# Patient Record
Sex: Male | Born: 1980 | Hispanic: Yes | Smoking: Never smoker
Health system: Southern US, Community
[De-identification: ages and names within clinical notes are randomized; demographics above are authoritative.]

---

## 2014-12-02 ENCOUNTER — Emergency Department (HOSPITAL_COMMUNITY): Payer: Self-pay | Admitting: Anesthesiology

## 2014-12-02 ENCOUNTER — Encounter (HOSPITAL_COMMUNITY): Payer: Self-pay | Admitting: Emergency Medicine

## 2014-12-02 ENCOUNTER — Emergency Department (HOSPITAL_COMMUNITY): Payer: Self-pay

## 2014-12-02 ENCOUNTER — Inpatient Hospital Stay (HOSPITAL_COMMUNITY)
Admission: EM | Admit: 2014-12-02 | Discharge: 2014-12-07 | DRG: 025 | Disposition: A | Discharge status: Home / Self Care | Payer: Self-pay | Attending: Neurosurgery | Admitting: Neurosurgery

## 2014-12-02 ENCOUNTER — Encounter (HOSPITAL_COMMUNITY)
Admission: EM | Disposition: A | Discharge status: Home / Self Care | Payer: Self-pay | Source: Home / Self Care | Attending: Neurosurgery

## 2014-12-02 DIAGNOSIS — E876 Hypokalemia: Secondary | ICD-10-CM | POA: Diagnosis present

## 2014-12-02 DIAGNOSIS — G936 Cerebral edema: Secondary | ICD-10-CM | POA: Diagnosis present

## 2014-12-02 DIAGNOSIS — S065X9A Traumatic subdural hemorrhage with loss of consciousness of unspecified duration, initial encounter: Secondary | ICD-10-CM | POA: Diagnosis present

## 2014-12-02 DIAGNOSIS — D649 Anemia, unspecified: Secondary | ICD-10-CM | POA: Diagnosis present

## 2014-12-02 DIAGNOSIS — W19XXXA Unspecified fall, initial encounter: Secondary | ICD-10-CM | POA: Diagnosis present

## 2014-12-02 DIAGNOSIS — S065XAA Traumatic subdural hemorrhage with loss of consciousness status unknown, initial encounter: Secondary | ICD-10-CM | POA: Diagnosis present

## 2014-12-02 DIAGNOSIS — S065X0A Traumatic subdural hemorrhage without loss of consciousness, initial encounter: Principal | ICD-10-CM | POA: Diagnosis present

## 2014-12-02 DIAGNOSIS — F17210 Nicotine dependence, cigarettes, uncomplicated: Secondary | ICD-10-CM | POA: Diagnosis present

## 2014-12-02 DIAGNOSIS — I629 Nontraumatic intracranial hemorrhage, unspecified: Secondary | ICD-10-CM | POA: Insufficient documentation

## 2014-12-02 DIAGNOSIS — R4702 Dysphasia: Secondary | ICD-10-CM | POA: Diagnosis present

## 2014-12-02 DIAGNOSIS — S0219XA Other fracture of base of skull, initial encounter for closed fracture: Secondary | ICD-10-CM | POA: Insufficient documentation

## 2014-12-02 DIAGNOSIS — R40241 Glasgow coma scale score 13-15: Secondary | ICD-10-CM | POA: Diagnosis present

## 2014-12-02 HISTORY — PX: CRANIOTOMY: SHX93

## 2014-12-02 LAB — ETHANOL

## 2014-12-02 LAB — CBC
HCT: 35.5 % — ABNORMAL LOW (ref 39.0–52.0)
HEMOGLOBIN: 12.4 g/dL — AB (ref 13.0–17.0)
MCH: 31.2 pg (ref 26.0–34.0)
MCHC: 34.9 g/dL (ref 30.0–36.0)
MCV: 89.4 fL (ref 78.0–100.0)
Platelets: 235 10*3/uL (ref 150–400)
RBC: 3.97 MIL/uL — ABNORMAL LOW (ref 4.22–5.81)
RDW: 12.6 % (ref 11.5–15.5)
WBC: 5 10*3/uL (ref 4.0–10.5)

## 2014-12-02 LAB — I-STAT CHEM 8, ED
BUN: 12 mg/dL (ref 6–23)
CHLORIDE: 98 mmol/L (ref 96–112)
Calcium, Ion: 1.15 mmol/L (ref 1.12–1.23)
Creatinine, Ser: 0.8 mg/dL (ref 0.50–1.35)
Glucose, Bld: 109 mg/dL — ABNORMAL HIGH (ref 70–99)
HCT: 45 % (ref 39.0–52.0)
Hemoglobin: 15.3 g/dL (ref 13.0–17.0)
Potassium: 3.3 mmol/L — ABNORMAL LOW (ref 3.5–5.1)
SODIUM: 139 mmol/L (ref 135–145)
TCO2: 24 mmol/L (ref 0–100)

## 2014-12-02 LAB — APTT: APTT: 31 s (ref 24–37)

## 2014-12-02 LAB — CBC WITH DIFFERENTIAL/PLATELET
BASOS PCT: 1 % (ref 0–1)
Basophils Absolute: 0 10*3/uL (ref 0.0–0.1)
Eosinophils Absolute: 0.1 10*3/uL (ref 0.0–0.7)
Eosinophils Relative: 2 % (ref 0–5)
HEMATOCRIT: 38.9 % — AB (ref 39.0–52.0)
HEMOGLOBIN: 13.3 g/dL (ref 13.0–17.0)
Lymphocytes Relative: 34 % (ref 12–46)
Lymphs Abs: 1.7 10*3/uL (ref 0.7–4.0)
MCH: 30.6 pg (ref 26.0–34.0)
MCHC: 34.2 g/dL (ref 30.0–36.0)
MCV: 89.4 fL (ref 78.0–100.0)
MONO ABS: 0.5 10*3/uL (ref 0.1–1.0)
MONOS PCT: 10 % (ref 3–12)
NEUTROS ABS: 2.6 10*3/uL (ref 1.7–7.7)
Neutrophils Relative %: 53 % (ref 43–77)
Platelets: 280 10*3/uL (ref 150–400)
RBC: 4.35 MIL/uL (ref 4.22–5.81)
RDW: 12.6 % (ref 11.5–15.5)
WBC: 4.9 10*3/uL (ref 4.0–10.5)

## 2014-12-02 LAB — BASIC METABOLIC PANEL
Anion gap: 9 (ref 5–15)
BUN: 9 mg/dL (ref 6–23)
CO2: 28 mmol/L (ref 19–32)
Calcium: 9.1 mg/dL (ref 8.4–10.5)
Chloride: 102 mmol/L (ref 96–112)
Creatinine, Ser: 0.76 mg/dL (ref 0.50–1.35)
GFR calc Af Amer: >90 mL/min (ref >90)
GFR calc non Af Amer: >90 mL/min (ref >90)
Glucose, Bld: 103 mg/dL — ABNORMAL HIGH (ref 70–99)
POTASSIUM: 2.8 mmol/L — AB (ref 3.5–5.1)
Sodium: 139 mmol/L (ref 135–145)

## 2014-12-02 LAB — ABO/RH: ABO/RH(D): O POS

## 2014-12-02 LAB — TYPE AND SCREEN
ABO/RH(D): O POS
Antibody Screen: NEGATIVE

## 2014-12-02 LAB — PROTIME-INR
INR: 1.02 (ref 0.00–1.49)
PROTHROMBIN TIME: 13.5 s (ref 11.6–15.2)

## 2014-12-02 LAB — MRSA PCR SCREENING: MRSA by PCR: NEGATIVE

## 2014-12-02 SURGERY — CRANIOTOMY HEMATOMA EVACUATION SUBDURAL
Anesthesia: General | Site: Head | Laterality: Left

## 2014-12-02 MED ORDER — PROMETHAZINE HCL 25 MG PO TABS: 12.5000 mg | ORAL_TABLET | ORAL | Status: DC | PRN

## 2014-12-02 MED ORDER — ONDANSETRON HCL 4 MG PO TABS: 4.0000 mg | ORAL_TABLET | ORAL | Status: DC | PRN

## 2014-12-02 MED ORDER — PROPOFOL 10 MG/ML IV BOLUS
INTRAVENOUS | Status: DC | PRN
  Administered 2014-12-02: 40 mg via INTRAVENOUS
  Administered 2014-12-02: 160 mg via INTRAVENOUS

## 2014-12-02 MED ORDER — SODIUM CHLORIDE 0.9 % IR SOLN
Status: DC | PRN
  Administered 2014-12-02: 19:00:00

## 2014-12-02 MED ORDER — BUPIVACAINE-EPINEPHRINE 0.5% -1:200000 IJ SOLN
INTRAMUSCULAR | Status: DC | PRN
  Administered 2014-12-02: 15 mL

## 2014-12-02 MED ORDER — PANTOPRAZOLE SODIUM 40 MG IV SOLR
40.0000 mg | Freq: Every day | INTRAVENOUS | Status: DC
  Administered 2014-12-02 – 2014-12-03 (×2): 40 mg via INTRAVENOUS
  Filled 2014-12-02 (×4): qty 40

## 2014-12-02 MED ORDER — EPHEDRINE SULFATE 50 MG/ML IJ SOLN
INTRAMUSCULAR | Status: AC
Start: 2014-12-02 — End: 2014-12-02
  Filled 2014-12-02: qty 1

## 2014-12-02 MED ORDER — FENTANYL CITRATE 0.05 MG/ML IJ SOLN
INTRAMUSCULAR | Status: AC
  Filled 2014-12-02: qty 5

## 2014-12-02 MED ORDER — ARTIFICIAL TEARS OP OINT
TOPICAL_OINTMENT | OPHTHALMIC | Status: AC
  Filled 2014-12-02: qty 3.5

## 2014-12-02 MED ORDER — HEMOSTATIC AGENTS (NO CHARGE) OPTIME
TOPICAL | Status: DC | PRN
  Administered 2014-12-02: 1 via TOPICAL

## 2014-12-02 MED ORDER — SODIUM CHLORIDE 0.9 % IJ SOLN
INTRAMUSCULAR | Status: AC
  Filled 2014-12-02: qty 20

## 2014-12-02 MED ORDER — FENTANYL CITRATE 0.05 MG/ML IJ SOLN
INTRAMUSCULAR | Status: DC | PRN
  Administered 2014-12-02 (×5): 50 ug via INTRAVENOUS

## 2014-12-02 MED ORDER — OXYCODONE HCL 5 MG/5ML PO SOLN
5.0000 mg | Freq: Once | ORAL | Status: DC | PRN
  Filled 2014-12-02: qty 5

## 2014-12-02 MED ORDER — CEFAZOLIN SODIUM-DEXTROSE 2-3 GM-% IV SOLR
2.0000 g | Freq: Three times a day (TID) | INTRAVENOUS | Status: AC
  Administered 2014-12-03 (×2): 2 g via INTRAVENOUS
  Filled 2014-12-02 (×2): qty 50

## 2014-12-02 MED ORDER — LABETALOL HCL 5 MG/ML IV SOLN
INTRAVENOUS | Status: AC
  Filled 2014-12-02: qty 4

## 2014-12-02 MED ORDER — FENTANYL CITRATE 0.05 MG/ML IJ SOLN
25.0000 ug | INTRAMUSCULAR | Status: DC | PRN
  Administered 2014-12-02: 25 ug via INTRAVENOUS

## 2014-12-02 MED ORDER — ONDANSETRON HCL 4 MG/2ML IJ SOLN
4.0000 mg | INTRAMUSCULAR | Status: DC | PRN
  Administered 2014-12-03: 4 mg via INTRAVENOUS
  Filled 2014-12-02: qty 2

## 2014-12-02 MED ORDER — LEVETIRACETAM IN NACL 500 MG/100ML IV SOLN
500.0000 mg | Freq: Two times a day (BID) | INTRAVENOUS | Status: DC
  Filled 2014-12-02: qty 100

## 2014-12-02 MED ORDER — LABETALOL HCL 5 MG/ML IV SOLN: 10.0000 mg | INTRAVENOUS | Status: DC | PRN

## 2014-12-02 MED ORDER — LACTATED RINGERS IV SOLN
INTRAVENOUS | Status: DC | PRN
  Administered 2014-12-02: 17:00:00 via INTRAVENOUS

## 2014-12-02 MED ORDER — ACETAMINOPHEN 650 MG RE SUPP: 650.0000 mg | RECTAL | Status: DC | PRN

## 2014-12-02 MED ORDER — SUCCINYLCHOLINE CHLORIDE 20 MG/ML IJ SOLN
INTRAMUSCULAR | Status: DC | PRN
  Administered 2014-12-02: 100 mg via INTRAVENOUS

## 2014-12-02 MED ORDER — PROPOFOL 10 MG/ML IV BOLUS
INTRAVENOUS | Status: AC
  Filled 2014-12-02: qty 20

## 2014-12-02 MED ORDER — LEVETIRACETAM IN NACL 500 MG/100ML IV SOLN
500.0000 mg | INTRAVENOUS | Status: AC
  Administered 2014-12-02: 500 mg via INTRAVENOUS
  Filled 2014-12-02: qty 100

## 2014-12-02 MED ORDER — POTASSIUM CHLORIDE IN NACL 20-0.9 MEQ/L-% IV SOLN
INTRAVENOUS | Status: DC
  Administered 2014-12-02: 23:00:00 via INTRAVENOUS
  Filled 2014-12-02 (×2): qty 1000

## 2014-12-02 MED ORDER — LIDOCAINE HCL (CARDIAC) 20 MG/ML IV SOLN
INTRAVENOUS | Status: AC
  Filled 2014-12-02: qty 5

## 2014-12-02 MED ORDER — MIDAZOLAM HCL 5 MG/5ML IJ SOLN
INTRAMUSCULAR | Status: DC | PRN
  Administered 2014-12-02: 2 mg via INTRAVENOUS

## 2014-12-02 MED ORDER — MIDAZOLAM HCL 2 MG/2ML IJ SOLN
INTRAMUSCULAR | Status: AC
  Filled 2014-12-02: qty 2

## 2014-12-02 MED ORDER — FENTANYL CITRATE 0.05 MG/ML IJ SOLN
INTRAMUSCULAR | Status: AC
  Filled 2014-12-02: qty 2

## 2014-12-02 MED ORDER — ONDANSETRON HCL 4 MG/2ML IJ SOLN
INTRAMUSCULAR | Status: DC | PRN
  Administered 2014-12-02: 4 mg via INTRAVENOUS

## 2014-12-02 MED ORDER — SUCCINYLCHOLINE CHLORIDE 20 MG/ML IJ SOLN
INTRAMUSCULAR | Status: AC
  Filled 2014-12-02: qty 1

## 2014-12-02 MED ORDER — CEFAZOLIN SODIUM-DEXTROSE 2-3 GM-% IV SOLR
INTRAVENOUS | Status: DC | PRN
  Administered 2014-12-02: 2 g via INTRAVENOUS

## 2014-12-02 MED ORDER — BACITRACIN ZINC 500 UNIT/GM EX OINT
TOPICAL_OINTMENT | CUTANEOUS | Status: DC | PRN
  Administered 2014-12-02: 1 via TOPICAL

## 2014-12-02 MED ORDER — ESMOLOL HCL 10 MG/ML IV SOLN
INTRAVENOUS | Status: AC
  Filled 2014-12-02: qty 10

## 2014-12-02 MED ORDER — ROCURONIUM BROMIDE 50 MG/5ML IV SOLN
INTRAVENOUS | Status: AC
  Filled 2014-12-02: qty 2

## 2014-12-02 MED ORDER — ACETAMINOPHEN 325 MG PO TABS
650.0000 mg | ORAL_TABLET | ORAL | Status: DC | PRN
  Administered 2014-12-07: 650 mg via ORAL
  Filled 2014-12-02: qty 2

## 2014-12-02 MED ORDER — ONDANSETRON HCL 4 MG/2ML IJ SOLN
INTRAMUSCULAR | Status: AC
  Filled 2014-12-02: qty 2

## 2014-12-02 MED ORDER — LEVETIRACETAM IN NACL 500 MG/100ML IV SOLN
500.0000 mg | Freq: Two times a day (BID) | INTRAVENOUS | Status: DC
  Administered 2014-12-03 – 2014-12-05 (×5): 500 mg via INTRAVENOUS
  Filled 2014-12-02 (×6): qty 100

## 2014-12-02 MED ORDER — THROMBIN 20000 UNITS EX SOLR
CUTANEOUS | Status: DC | PRN
  Administered 2014-12-02: 19:00:00 via TOPICAL

## 2014-12-02 MED ORDER — PROPOFOL 10 MG/ML IV BOLUS
INTRAVENOUS | Status: AC
Start: 2014-12-02 — End: 2014-12-02
  Filled 2014-12-02: qty 20

## 2014-12-02 MED ORDER — LABETALOL HCL 5 MG/ML IV SOLN
INTRAVENOUS | Status: DC | PRN
  Administered 2014-12-02 (×3): 2.5 mg via INTRAVENOUS

## 2014-12-02 MED ORDER — OXYCODONE HCL 5 MG PO TABS: 5.0000 mg | ORAL_TABLET | Freq: Once | ORAL | Status: DC | PRN

## 2014-12-02 MED ORDER — PHENYLEPHRINE 40 MCG/ML (10ML) SYRINGE FOR IV PUSH (FOR BLOOD PRESSURE SUPPORT)
PREFILLED_SYRINGE | INTRAVENOUS | Status: AC
  Filled 2014-12-02: qty 20

## 2014-12-02 MED ORDER — MORPHINE SULFATE 2 MG/ML IJ SOLN
1.0000 mg | INTRAMUSCULAR | Status: DC | PRN
  Administered 2014-12-03: 1 mg via INTRAVENOUS
  Administered 2014-12-03 – 2014-12-07 (×15): 2 mg via INTRAVENOUS
  Filled 2014-12-02 (×17): qty 1

## 2014-12-02 MED ORDER — ONDANSETRON HCL 4 MG/2ML IJ SOLN: 4.0000 mg | Freq: Four times a day (QID) | INTRAMUSCULAR | Status: DC | PRN

## 2014-12-02 MED ORDER — MICROFIBRILLAR COLL HEMOSTAT EX POWD
CUTANEOUS | Status: DC | PRN
  Administered 2014-12-02: 1 g via TOPICAL

## 2014-12-02 MED ORDER — LIDOCAINE HCL (CARDIAC) 20 MG/ML IV SOLN
INTRAVENOUS | Status: DC | PRN
  Administered 2014-12-02: 60 mg via INTRAVENOUS

## 2014-12-02 MED ORDER — 0.9 % SODIUM CHLORIDE (POUR BTL) OPTIME
TOPICAL | Status: DC | PRN
  Administered 2014-12-02 (×2): 1000 mL

## 2014-12-02 SURGICAL SUPPLY — 73 items
BAG DECANTER FOR FLEXI CONT (MISCELLANEOUS) ×3 IMPLANT
BIT DRILL WIRE PASS 1.3MM (BIT) IMPLANT
BLADE CLIPPER SURG NEURO (BLADE) ×3 IMPLANT
BRUSH SCRUB EZ 1% IODOPHOR (MISCELLANEOUS) IMPLANT
BRUSH SCRUB EZ PLAIN DRY (MISCELLANEOUS) ×3 IMPLANT
BUR ACORN 6.0 PRECISION (BURR) ×2 IMPLANT
BUR ACORN 6.0MM PRECISION (BURR) ×1
BUR ROUTER D-58 CRANI (BURR) ×3 IMPLANT
CANISTER SUCT 3000ML PPV (MISCELLANEOUS) ×3 IMPLANT
CLIP TI MEDIUM 6 (CLIP) IMPLANT
CONT SPEC 4OZ CLIKSEAL STRL BL (MISCELLANEOUS) ×3 IMPLANT
COVER BACK TABLE 60X90IN (DRAPES) IMPLANT
DRAIN SNY WOU 7FLT (WOUND CARE) IMPLANT
DRAPE NEUROLOGICAL W/INCISE (DRAPES) ×3 IMPLANT
DRAPE SURG 17X23 STRL (DRAPES) IMPLANT
DRAPE WARM FLUID 44X44 (DRAPE) ×3 IMPLANT
DRILL WIRE PASS 1.3MM (BIT)
DRSG TELFA 3X8 NADH (GAUZE/BANDAGES/DRESSINGS) IMPLANT
ELECT CAUTERY BLADE 6.4 (BLADE) IMPLANT
ELECT REM PT RETURN 9FT ADLT (ELECTROSURGICAL) ×3
ELECTRODE REM PT RTRN 9FT ADLT (ELECTROSURGICAL) ×1 IMPLANT
EVACUATOR 1/8 PVC DRAIN (DRAIN) IMPLANT
EVACUATOR SILICONE 100CC (DRAIN) IMPLANT
FORCEPS BIPOLAR SPETZLER 8 1.0 (NEUROSURGERY SUPPLIES) ×3 IMPLANT
GAUZE SPONGE 4X4 12PLY STRL (GAUZE/BANDAGES/DRESSINGS) ×3 IMPLANT
GAUZE SPONGE 4X4 16PLY XRAY LF (GAUZE/BANDAGES/DRESSINGS) IMPLANT
GLOVE BIO SURGEON STRL SZ 6.5 (GLOVE) ×4 IMPLANT
GLOVE BIO SURGEON STRL SZ8 (GLOVE) ×3 IMPLANT
GLOVE BIO SURGEON STRL SZ8.5 (GLOVE) ×3 IMPLANT
GLOVE BIO SURGEONS STRL SZ 6.5 (GLOVE) ×2
GLOVE BIOGEL PI IND STRL 6.5 (GLOVE) ×1 IMPLANT
GLOVE BIOGEL PI INDICATOR 6.5 (GLOVE) ×2
GLOVE EXAM NITRILE LRG STRL (GLOVE) IMPLANT
GLOVE EXAM NITRILE MD LF STRL (GLOVE) IMPLANT
GLOVE EXAM NITRILE XL STR (GLOVE) IMPLANT
GLOVE EXAM NITRILE XS STR PU (GLOVE) IMPLANT
GOWN STRL REUS W/ TWL LRG LVL3 (GOWN DISPOSABLE) ×1 IMPLANT
GOWN STRL REUS W/ TWL XL LVL3 (GOWN DISPOSABLE) ×1 IMPLANT
GOWN STRL REUS W/TWL LRG LVL3 (GOWN DISPOSABLE) ×2
GOWN STRL REUS W/TWL XL LVL3 (GOWN DISPOSABLE) ×2
KIT BASIN OR (CUSTOM PROCEDURE TRAY) ×3 IMPLANT
KIT CLIP RANEY GUN (KITS) ×3 IMPLANT
KIT ROOM TURNOVER OR (KITS) ×3 IMPLANT
MARKER SKIN DUAL TIP RULER LAB (MISCELLANEOUS) IMPLANT
NEEDLE HYPO 22GX1.5 SAFETY (NEEDLE) ×3 IMPLANT
NS IRRIG 1000ML POUR BTL (IV SOLUTION) ×6 IMPLANT
PACK CRANIOTOMY (CUSTOM PROCEDURE TRAY) ×3 IMPLANT
PAD ARMBOARD 7.5X6 YLW CONV (MISCELLANEOUS) ×6 IMPLANT
PATTIES SURGICAL .25X.25 (GAUZE/BANDAGES/DRESSINGS) IMPLANT
PATTIES SURGICAL .5 X.5 (GAUZE/BANDAGES/DRESSINGS) IMPLANT
PATTIES SURGICAL .5 X3 (DISPOSABLE) IMPLANT
PATTIES SURGICAL 1X1 (DISPOSABLE) IMPLANT
PIN MAYFIELD SKULL DISP (PIN) ×3 IMPLANT
PLATE 1.5  2HOLE MED NEURO (Plate) ×6 IMPLANT
PLATE 1.5 2HOLE MED NEURO (Plate) ×3 IMPLANT
RUBBERBAND STERILE (MISCELLANEOUS) IMPLANT
SCREW SELF DRILL HT 1.5/4MM (Screw) ×18 IMPLANT
SPONGE NEURO XRAY DETECT 1X3 (DISPOSABLE) IMPLANT
SPONGE SURGIFOAM ABS GEL 100 (HEMOSTASIS) ×3 IMPLANT
STAPLER SKIN PROX WIDE 3.9 (STAPLE) ×3 IMPLANT
SUT ETHILON 3 0 FSL (SUTURE) IMPLANT
SUT NURALON 4 0 TR CR/8 (SUTURE) ×6 IMPLANT
SUT PROLENE 6 0 BV (SUTURE) IMPLANT
SUT VIC AB 2-0 CP2 18 (SUTURE) ×3 IMPLANT
SUT VIC AB 3-0 FS2 27 (SUTURE) IMPLANT
SUT VICRYL 4-0 PS2 18IN ABS (SUTURE) IMPLANT
SYR CONTROL 10ML LL (SYRINGE) ×3 IMPLANT
TAPE CLOTH SURG 4X10 WHT LF (GAUZE/BANDAGES/DRESSINGS) ×3 IMPLANT
TOWEL OR 17X24 6PK STRL BLUE (TOWEL DISPOSABLE) ×3 IMPLANT
TOWEL OR 17X26 10 PK STRL BLUE (TOWEL DISPOSABLE) ×3 IMPLANT
TRAY FOLEY CATH 14FRSI W/METER (CATHETERS) IMPLANT
UNDERPAD 30X30 INCONTINENT (UNDERPADS AND DIAPERS) IMPLANT
WATER STERILE IRR 1000ML POUR (IV SOLUTION) ×3 IMPLANT
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
## 2014-12-02 NOTE — ED Notes (Signed)
Pt denies having family or friends that could come to the hospital or speak to our physician.

## 2014-12-02 NOTE — Transfer of Care (Signed)
Immediate Anesthesia Transfer of Care Note  Patient: Brett Cole  Procedure(s) Performed: Procedure(s): Craniotomy for Evacuation of Subdural Hematoma (Left)  Patient Location: PACU  Anesthesia Type:General  Level of Consciousness: awake and alert   Airway & Oxygen Therapy: Patient Spontanous Breathing and Patient connected to nasal cannula oxygen  Post-op Assessment: Report given to RN and Post -op Vital signs reviewed and stable  Post vital signs: Reviewed and stable  Last Vitals:  Filed Vitals:   12/02/14 1700  BP: 112/83  Pulse: 67  Temp:   Resp: 16    Complications: No apparent anesthesia complications   BP 116/92, HR 79, SPO2 100. Pt moving all extremities. Awake and alert.

## 2014-12-02 NOTE — ED Notes (Signed)
Pt does not speak [place]. PA will translate.
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
## 2014-12-02 NOTE — Op Note (Signed)
Brief history: The patient is a 34 year old Hispanic immigrant from [place] who presented to the ER with dysphasia. He was worked up with a head CT which demonstrated a left subdural hematoma, cerebral contusions, and midline shift. I discussed the situation as best I could with the patient dysphasia. I recommended surgery. I explained the risks, benefits, alternatives, and likelihood of achieving her goals with surgery. I answered all the patient's questions. He has decided to proceed with surgery. There are no family members available.  Preop diagnosis: Left subdural hematoma, left cerebral contusions,  Postop diagnosis: Same  Procedure: Left craniotomy for evacuation of subdural hematoma  Surgeon: Dr. Delma OfficerJeff Lenoria Narine  Assistant: None  Anesthesia: Gen. endotracheal  Estimated blood loss: 100 mL  Specimens: None  Drains: One subtemporal Jackson-Pratt drain  Complications: None  Description of procedure: The patient was brought to the operating room by the anesthesia team. General endotracheal anesthesia was induced. I applied the Mayfield 3 point headrest to the patient's calvarium. A roll was placed under his left shoulder and his head was turned to the right. The patient's left scalp was shaved with clippers and then prepared with Betadine scrub and Betadine solution. Sterile drapes were applied. I then injected the area to be incised with Marcaine with epinephrine solution. I used the scalpel to make a pterional type incision in the patient's left scalp. I used Raney clips for wound edge hemostasis. I incised the underlying temporalis muscle and fascia with the electrocautery. I exposed the underlying calvarium with electrocautery and periosteal elevators. I used the cerebellar retractors for exposure. I then used a high-speed drill to create a temporal and parietal burr hole. I used the foot plate device to create a craniotomy flap. I elevated the craniotomy flap with a Penfield #1. I incised  the underlying dura with the 15 blade scalpel. We encountered a fairly large acute to subacute subdural hematoma. I removed it using suction and irrigation. The patient's frontal and temporal lobe were quite contused. We irrigated the subdural space out with saline solution. We obtained hemostasis with irrigation and bipolar electrocautery. I then reapproximated patient's dura with a running and interrupted 4-0 Nurolon suture. I placed a large piece of Gelfoam over the exposed dura and replaced the craniotomy flap with titanium mini plates and screws. I placed a 10 mm flat Jackson-Pratt drain in the subtemporal space and tunneled it out through a separate stab wound. I reapproximated patient's galea and temporalis fascia with interrupted 2-0 Vicryl suture. I reapproximated the galea with interrupted 2-0 Vicryl suture. I reapproximated the skin with stainless steel staples. The wound was then coated with bacitracin ointment. A sterile dressing was applied. The drapes were removed. I then removed the Mayfield 3 point headrest on the patient's calvarium. The patient was subsequent extubated by the anesthesia team and transported to the post anesthesia care unit in stable condition. By report all sponge, instrument, and needle counts were correct at the end this case.
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
## 2014-12-02 NOTE — ED Notes (Addendum)
Pt transported to Neuro OR safely. Pt belongings in labeled pt belonging bag, pants with belt, socks, shoes, a cell phone, shirt, coat. OR Consent for procedure transported with pt.

## 2014-12-02 NOTE — Anesthesia Procedure Notes (Signed)
Procedure Name: Intubation Date/Time: 12/02/2014 5:51 PM Performed by: Dairl PonderJIANG, Tashonda Pinkus Pre-anesthesia Checklist: Patient identified, Timeout performed, Emergency Drugs available, Suction available and Patient being monitored Patient Re-evaluated:Patient Re-evaluated prior to inductionOxygen Delivery Method: Circle system utilized, Simple face mask, Non-rebreather mask, Nasal cannula and Ambu bag Preoxygenation: Pre-oxygenation with 100% oxygen Intubation Type: IV induction and Rapid sequence Laryngoscope Size: Mac and 3 Grade View: Grade I Tube type: Oral Tube size: 7.5 mm Number of attempts: 1 Placement Confirmation: ETT inserted through vocal cords under direct vision,  breath sounds checked- equal and bilateral and positive ETCO2 Secured at: 23 cm Tube secured with: Tape Dental Injury: Teeth and Oropharynx as per pre-operative assessment

## 2014-12-02 NOTE — Anesthesia Preprocedure Evaluation (Signed)
Anesthesia Evaluation  Patient identified by MRN, date of birth, ID band Patient awake    Reviewed: Allergy & Precautions, NPO status , Patient's Chart, lab work & pertinent test results  Airway Mallampati: I   Neck ROM: full    Dental   Pulmonary Current Smoker,  breath sounds clear to auscultation        Cardiovascular negative cardio ROS  Rhythm:regular Rate:Normal     Neuro/Psych Pt has a SDH    GI/Hepatic   Endo/Other    Renal/GU      Musculoskeletal   Abdominal   Peds  Hematology   Anesthesia Other Findings   Reproductive/Obstetrics                             Anesthesia Physical Anesthesia Plan  ASA: I and emergent  Anesthesia Plan: General   Post-op Pain Management:    Induction: Intravenous, Rapid sequence and Cricoid pressure planned  Airway Management Planned: Oral ETT  Additional Equipment:   Intra-op Plan:   Post-operative Plan: Extubation in OR  Informed Consent: I have reviewed the patients History and Physical, chart, labs and discussed the procedure including the risks, benefits and alternatives for the proposed anesthesia with the patient or authorized representative who has indicated his/her understanding and acceptance.     Plan Discussed with: CRNA, Anesthesiologist and Surgeon  Anesthesia Plan Comments:         Anesthesia Quick Evaluation

## 2014-12-02 NOTE — Anesthesia Postprocedure Evaluation (Signed)
  Anesthesia Post-op Note  Patient: Brett Cole  Procedure(s) Performed: Procedure(s): Craniotomy for Evacuation of Subdural Hematoma (Left)  Patient Location: PACU  Anesthesia Type: General   Level of Consciousness: awake, alert  and oriented  Airway and Oxygen Therapy: Patient Spontanous Breathing  Post-op Pain: mild  Post-op Assessment: Post-op Vital signs reviewed  Post-op Vital Signs: Reviewed  Last Vitals:  Filed Vitals:   12/02/14 2015  BP: 139/90  Pulse: 66  Temp: 36.4 C  Resp: 23    Complications: No apparent anesthesia complications

## 2014-12-03 ENCOUNTER — Encounter (HOSPITAL_COMMUNITY): Payer: Self-pay | Admitting: Neurosurgery

## 2014-12-03 ENCOUNTER — Inpatient Hospital Stay (HOSPITAL_COMMUNITY): Payer: Self-pay

## 2014-12-03 DIAGNOSIS — I6200 Nontraumatic subdural hemorrhage, unspecified: Secondary | ICD-10-CM

## 2014-12-03 DIAGNOSIS — I629 Nontraumatic intracranial hemorrhage, unspecified: Secondary | ICD-10-CM | POA: Insufficient documentation

## 2014-12-03 LAB — BASIC METABOLIC PANEL
Anion gap: 8 (ref 5–15)
BUN: 6 mg/dL (ref 6–23)
CO2: 26 mmol/L (ref 19–32)
Calcium: 8.7 mg/dL (ref 8.4–10.5)
Chloride: 105 mmol/L (ref 96–112)
Creatinine, Ser: 0.72 mg/dL (ref 0.50–1.35)
GFR calc Af Amer: >90 mL/min (ref >90)
Glucose, Bld: 105 mg/dL — ABNORMAL HIGH (ref 70–99)
Potassium: 3.8 mmol/L (ref 3.5–5.1)
Sodium: 139 mmol/L (ref 135–145)

## 2014-12-03 LAB — MAGNESIUM: MAGNESIUM: 1.9 mg/dL (ref 1.5–2.5)

## 2014-12-03 LAB — CBC WITH DIFFERENTIAL/PLATELET
BASOS ABS: 0 10*3/uL (ref 0.0–0.1)
Basophils Relative: 0 % (ref 0–1)
Eosinophils Absolute: 0.1 10*3/uL (ref 0.0–0.7)
Eosinophils Relative: 1 % (ref 0–5)
HCT: 34.9 % — ABNORMAL LOW (ref 39.0–52.0)
Hemoglobin: 11.8 g/dL — ABNORMAL LOW (ref 13.0–17.0)
LYMPHS PCT: 14 % (ref 12–46)
Lymphs Abs: 1.1 10*3/uL (ref 0.7–4.0)
MCH: 30.6 pg (ref 26.0–34.0)
MCHC: 33.8 g/dL (ref 30.0–36.0)
MCV: 90.6 fL (ref 78.0–100.0)
Monocytes Absolute: 0.8 10*3/uL (ref 0.1–1.0)
Monocytes Relative: 10 % (ref 3–12)
NEUTROS PCT: 75 % (ref 43–77)
Neutro Abs: 6.2 10*3/uL (ref 1.7–7.7)
PLATELETS: 246 10*3/uL (ref 150–400)
RBC: 3.85 MIL/uL — ABNORMAL LOW (ref 4.22–5.81)
RDW: 12.8 % (ref 11.5–15.5)
WBC: 8.2 10*3/uL (ref 4.0–10.5)

## 2014-12-03 LAB — RAPID URINE DRUG SCREEN, HOSP PERFORMED
Amphetamines: NOT DETECTED
Barbiturates: NOT DETECTED
Benzodiazepines: POSITIVE — AB
COCAINE: POSITIVE — AB
Opiates: POSITIVE — AB
Tetrahydrocannabinol: POSITIVE — AB

## 2014-12-03 LAB — PHOSPHORUS: PHOSPHORUS: 3.5 mg/dL (ref 2.3–4.6)

## 2014-12-03 LAB — TROPONIN I: Troponin I: <0.03 ng/mL (ref <0.031)

## 2014-12-03 MED ORDER — FOLIC ACID 1 MG PO TABS
1.0000 mg | ORAL_TABLET | Freq: Every day | ORAL | Status: DC
  Administered 2014-12-03 – 2014-12-07 (×5): 1 mg via ORAL
  Filled 2014-12-03 (×5): qty 1

## 2014-12-03 MED ORDER — POTASSIUM CHLORIDE 10 MEQ/100ML IV SOLN
10.0000 meq | INTRAVENOUS | Status: AC
  Administered 2014-12-03 (×3): 10 meq via INTRAVENOUS
  Filled 2014-12-03 (×4): qty 100

## 2014-12-03 MED ORDER — THIAMINE HCL 100 MG/ML IJ SOLN
100.0000 mg | Freq: Every day | INTRAMUSCULAR | Status: DC
  Administered 2014-12-03: 100 mg via INTRAVENOUS
  Filled 2014-12-03 (×2): qty 1

## 2014-12-03 MED ORDER — SODIUM CHLORIDE 0.9 % IV SOLN
INTRAVENOUS | Status: DC
  Administered 2014-12-03 – 2014-12-04 (×2): via INTRAVENOUS
  Administered 2014-12-05: 1000 mL via INTRAVENOUS
  Administered 2014-12-06: 23:00:00 via INTRAVENOUS
  Administered 2014-12-06: 1000 mL via INTRAVENOUS
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
## 2014-12-04 DIAGNOSIS — S0219XA Other fracture of base of skull, initial encounter for closed fracture: Secondary | ICD-10-CM

## 2014-12-04 LAB — BASIC METABOLIC PANEL
ANION GAP: 7 (ref 5–15)
BUN: <5 mg/dL — ABNORMAL LOW (ref 6–23)
CALCIUM: 8.6 mg/dL (ref 8.4–10.5)
CHLORIDE: 101 mmol/L (ref 96–112)
CO2: 28 mmol/L (ref 19–32)
Creatinine, Ser: 0.73 mg/dL (ref 0.50–1.35)
GFR calc Af Amer: >90 mL/min (ref >90)
GFR calc non Af Amer: >90 mL/min (ref >90)
Glucose, Bld: 126 mg/dL — ABNORMAL HIGH (ref 70–99)
Potassium: 3.3 mmol/L — ABNORMAL LOW (ref 3.5–5.1)
SODIUM: 136 mmol/L (ref 135–145)

## 2014-12-04 LAB — MAGNESIUM: MAGNESIUM: 1.8 mg/dL (ref 1.5–2.5)

## 2014-12-04 MED ORDER — VITAMIN B-1 100 MG PO TABS
100.0000 mg | ORAL_TABLET | Freq: Every day | ORAL | Status: DC
  Administered 2014-12-04 – 2014-12-07 (×4): 100 mg via ORAL
  Filled 2014-12-04 (×4): qty 1

## 2014-12-04 MED ORDER — PANTOPRAZOLE SODIUM 40 MG PO TBEC
40.0000 mg | DELAYED_RELEASE_TABLET | Freq: Every day | ORAL | Status: DC
  Administered 2014-12-04 – 2014-12-07 (×4): 40 mg via ORAL
  Filled 2014-12-04 (×4): qty 1
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
## 2014-12-05 MED ORDER — LEVETIRACETAM 500 MG PO TABS
500.0000 mg | ORAL_TABLET | Freq: Two times a day (BID) | ORAL | Status: DC
  Administered 2014-12-05 – 2014-12-07 (×4): 500 mg via ORAL
  Filled 2014-12-05 (×4): qty 1
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
## 2014-12-05 NOTE — Progress Notes (Signed)
Patient ID: Brett Cole, male   DOB: 03/14/1981, 34 y.o.   MRN: [id number] Subjective:  The patient is alert and pleasant. He is accompanied by his girlfriend.  Objective: Vital signs in last 24 hours: Temp:  [97.8 F (36.6 C)-98.8 F (37.1 C)] 98.6 F (37 C) (03/17 1626) Pulse Rate:  [62-82] 62 (03/17 1626) Resp:  [16-18] 18 (03/17 1626) BP: (84-110)/(54-68) 102/60 mmHg (03/17 1838) SpO2:  [98 %-100 %] 98 % (03/17 1626)  Intake/Output from previous day: 03/16 0701 - 03/17 0700 In: 480 [P.O.:480] Out: -  Intake/Output this shift:    Physical exam the patient is alert and pleasant. The patient remains dysphasic. He is Glasgow Coma Scale 13 without change. His strength is normal. His wound is healing well.  Lab Results:  Recent Labs  12/02/14 2139 12/03/14 0915  WBC 5.0 8.2  HGB 12.4* 11.8*  HCT 35.5* 34.9*  PLT 235 246   BMET  Recent Labs  12/03/14 0915 12/04/14 1055  NA 139 136  K 3.8 3.3*  CL 105 101  CO2 26 28  GLUCOSE 105* 126*  BUN 6 <5*  CREATININE 0.72 0.73  CALCIUM 8.7 8.6    Studies/Results: No results found.  Assessment/Plan: Left subdural hematoma, cerebral contusions, dysphasia: The patient is making progress. I will ask rehabilitation to see the patient.  LOS: 3 days     Tanija Germani D 12/05/2014, 6:46 PM
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
## 2014-12-05 NOTE — Progress Notes (Signed)
Pt walked the length of the entire nursing station with stand by assist, tolerated well.

## 2014-12-05 NOTE — Clinical Documentation Improvement (Addendum)
(  1) Please document the clinical significance, if any, of the left temporal bone fracture noted on the CT report.  (2) Please document if a condition below provides greater specificity regarding the 8 mm of midline shift along the left cerebral convexity and subcortical edema:  Possible Clinical Conditions?    - Cerebral Edema  - Other Condition  - Unable to Clinically Determine  CT Head  12/02/14 Brain: There is a subdural hematoma around the left cerebral convexity 8 mm in thickness in the left frontal region. The hematoma is mixed density compatible with the history. Subcortical edema present in the temporal and frontal poles on the left. There are also 2 areas of subcortical edema in the high and posterior left frontal lobe, with a 2 cm hematoma more anteriorly. These higher areas of cortical edema are in an atypical location for contusion (given lack of overlying fracture) but is the best explanation given constellation of findings. There is midline shift to the right by 8 mm without ventricular entrapment or ACA infarct. Thin hematoma along the posterior clivus measuring 7-8 mm. There is a discrete subdural hematoma in the anterior foramen magnum without mass effect on the cervicomedullary junction. IMPRESSION: 1. Mixed density subdural hematoma along the left cerebral convexity with 8 mm of midline shift. 2. Multifocal left cerebral contusion with 2 cm hematoma. 3. Retroclival and ventral foramen magnum subdural hematoma without mass effect on the brain. 4. Questionable nondisplaced left temporal bone fracture with small focal mastoid effusion. 5. Negative for cervical spine fracture. Lateral atlantodental asymmetry which is likely positional.   Thank You, Jerral Ralphathy R Trew Sunde ,RN Clinical Documentation Specialist:  [phone] [facility]- [facility] Information Management

## 2014-12-05 NOTE — Progress Notes (Signed)
Key Points: Use following P&T approved IV to PO antibiotic change policy.  Description contains the criteria that are approved Note: Policy Excludes:  Esophagectomy patientsPHARMACIST - PHYSICIAN COMMUNICATION  DR:   Lovell SheehanJenkins and colleagues CONCERNING: IV to Oral Route Change Policy  RECOMMENDATION: This patient is receiving Keppra by the intravenous route.  Based on criteria approved by the Pharmacy and Therapeutics Committee, the intravenous medication(s) is/are being converted to the equivalent oral dose form(s).   DESCRIPTION: These criteria include:  The patient is eating (either orally or via tube) and/or has been taking other orally administered medications for a least 24 hours  The patient has no evidence of active gastrointestinal bleeding or impaired GI absorption (gastrectomy, short bowel, patient on TNA or NPO).  If you have questions about this conversion, please contact the Pharmacy Department  []   [phone]( [phone] )  Brett Cole [x]   [phone]( [phone] )  Brett Cole  []   [phone]( [phone] )  [facility] []   [phone]( [phone] )  [facility]   Brett Cole, PharmD, BCPS Clinical pharmacist, pager [phone] 12/05/2014 11:54 AM

## 2014-12-06 NOTE — Progress Notes (Signed)
Speech Language Pathology Treatment: Cognitive-Linquistic  Patient Details Name: Brett Cole MRN: [id number] DOB: 06/15/1981 Today's Date: 12/06/2014 Time: [id number] SLP Time Calculation (min) (ACUTE ONLY): 25 min  Assessment / Plan / Recommendation Clinical Impression  Pt demonstrates mild improvement in receptive and expressive language. Treatment provided in Spanish. Pt able to identify familiar objects via Y/N with 90% accuracy, but often perseveration impedes accuracy with more complex Y/N questions. Pt has severe phonemic paraphasias, worsened with spontaneous speech and with complexity of words. Pt will not be able to complete basic ADLs due to severely impaired receptive and expressive deficits. Girlfriend at bedside. SLP educated pt and girlfriend on the nature of aphasia. Situation concerning given need for outpatient f/u and lack of resources and family support. Will continue to follow and reach out to community resources for advice.    HPI     Pertinent Vitals Pain Assessment: Faces Faces Pain Scale: No hurt  SLP Plan  Continue with current plan of care    Recommendations  Outpatient SLP f/u.               Oral Care Recommendations: Oral care BID Follow up Recommendations: Outpatient SLP Plan: Continue with current plan of care    GO    [facility]enter LLCBonnie Aydee Mcnew, MA CCC-SLP [phone]  Claudine MoutonDeBlois, [place] 12/06/2014, 2:40 PM

## 2014-12-06 NOTE — Progress Notes (Signed)
Physical medicine and rehabilitation consult requested with chart reviewed. Presently modified independent 300 feet without assistive device. Patient able to negotiate around obstacles and furniture without deficits. Recommendations  for outpatient therapies. Patient will not need inpatient rehabilitation services. Hold on formal rehabilitation consult at this time.
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
## 2014-12-06 NOTE — Progress Notes (Signed)
Patient ID: Brett DankerFrancisco Cole, male   DOB: 03/20/1981, 34 y.o.   MRN: [id number] Subjective:  The patient is alert and pleasant. He remains dysphasic.  Objective: Vital signs in last 24 hours: Temp:  [97.7 F (36.5 C)-98.8 F (37.1 C)] 97.9 F (36.6 C) (03/18 0428) Pulse Rate:  [57-70] 57 (03/18 0428) Resp:  [18] 18 (03/17 1626) BP: (84-103)/(51-61) 93/58 mmHg (03/18 0428) SpO2:  [98 %-100 %] 100 % (03/18 0428)  Intake/Output from previous day: 03/17 0701 - 03/18 0700 In: -  Out: 300 [Urine:300] Intake/Output this shift:    Physical exam the patient is alert and pleasant. He is dysphasic. He is moving all 4 extremities well. His wound is healing well.  Lab Results:  Recent Labs  12/03/14 0915  WBC 8.2  HGB 11.8*  HCT 34.9*  PLT 246   BMET  Recent Labs  12/03/14 0915 12/04/14 1055  NA 139 136  K 3.8 3.3*  CL 105 101  CO2 26 28  GLUCOSE 105* 126*  BUN 6 <5*  CREATININE 0.72 0.73  CALCIUM 8.7 8.6    Studies/Results: No results found.  Assessment/Plan: Postop day #4, cerebral contusions, cerebral edema: The patient is improving neurologically. From my point of view he is ready for discharge but we have a problem with disposition. I will ask care management to see the patient.  LOS: 4 days     Graesyn Schreifels D 12/06/2014, 7:41 AM
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
## 2014-12-07 MED ORDER — HYDROCODONE-ACETAMINOPHEN 5-325 MG PO TABS
1.0000 | ORAL_TABLET | Freq: Once | ORAL | Status: AC
  Administered 2014-12-07: 1 via ORAL
  Filled 2014-12-07: qty 1

## 2014-12-07 MED ORDER — HYDROCODONE-ACETAMINOPHEN 5-325 MG PO TABS: 1.0000 | ORAL_TABLET | ORAL | Status: AC | PRN

## 2014-12-07 MED ORDER — LEVETIRACETAM 500 MG PO TABS: 500.0000 mg | ORAL_TABLET | Freq: Two times a day (BID) | ORAL | Status: AC

## 2014-12-07 NOTE — Progress Notes (Signed)
Patient ready for discharge to home; friend at bedside to accompany him home; case manager assisted with Rx's and Social worker provided a taxi voucher for discharge; interpreter called for Spanish speaking translation of discharge instructions; instructions given and reviewed with patient; also printed in [place]. Patient dressed and ready to leave;at time of discharge patient became agitated over losing his cell phone; room searched; no receipt from security office of any valuables; clothing and shoes only belongings in room; friend states, he may have lost it at the time of admission or it is at his residence; encourage patient to check at home upon discharge; patient became calmer and stated he was ready to leave; accompanied to taxi by staff.  Emergency resources were reviewed with patient and his visitor.  Follow up appointments reviewed as well, instructed to call for the appointment with Dr.Jenkins for f/u and staple removal from his head.

## 2014-12-07 NOTE — Discharge Summary (Signed)
Physician Discharge Summary  Patient ID: Brett Cole MRN: [id number] DOB/AGE: 03-18-1981 34 y.o.  Admit date: 12/02/2014 Discharge date: 12/07/2014  Admission Diagnoses: Left subdural hematoma, left cerebral contusions, dysphasia  Discharge Diagnoses: The same Active Problems:   Subdural hematoma   Intracranial bleed   Temporal bone fracture   Discharged Condition: fair  Hospital Course: I performed a left craniotomy for evacuation of subdural hematoma on the patient on 12/02/2014.  The patient's postoperative course was remarkable only for dysphasia. We had speech therapy see the patient. They have arranged outpatient therapy. I had the case manager see the patient regarding disposition. Arrangements for made for the patient be discharged to home. He was instructed to follow-up with me.  On 12/07/2014 patient was stable for discharge to home. He was given oral and written discharge instructions. I answered all his girlfriend's questions. The patient will be discharged on Her as I think there is a significant risk of seizures with his multiple cerebral contusions.  Consults: Speech therapy Significant Diagnostic Studies: Head CT Treatments: Left craniotomy for evacuation of subdural hematoma Discharge Exam: Blood pressure 109/68, pulse 64, temperature 97.8 F (36.6 C), temperature source Oral, resp. rate 18, height 5' (1.524 m), weight 63.504 kg (140 lb), SpO2 100 %. The patient is alert and pleasant. His strength is normal. His wound is healing well. His pupils are equal. He remains dysphasic but this is improving significantly.  Disposition: Home  Discharge Instructions    Call MD for:  difficulty breathing, headache or visual disturbances    Complete by:  As directed      Call MD for:  extreme fatigue    Complete by:  As directed      Call MD for:  hives    Complete by:  As directed      Call MD for:  persistant dizziness or light-headedness    Complete by:  As  directed      Call MD for:  persistant nausea and vomiting    Complete by:  As directed      Call MD for:  redness, tenderness, or signs of infection (pain, swelling, redness, odor or green/yellow discharge around incision site)    Complete by:  As directed      Call MD for:  severe uncontrolled pain    Complete by:  As directed      Call MD for:  temperature >100.4    Complete by:  As directed      Diet - low sodium heart healthy    Complete by:  As directed      Discharge instructions    Complete by:  As directed   Call [phone] for a followup appointment. Take a stool softener while you are using pain medications.     Driving Restrictions    Complete by:  As directed   Do not drive for 2 weeks.     Increase activity slowly    Complete by:  As directed      Lifting restrictions    Complete by:  As directed   Do not lift more than 5 pounds. No excessive bending or twisting.     May shower / Bathe    Complete by:  As directed   He may shower after the pain she is removed 3 days after surgery. Leave the incision alone.     No dressing needed    Complete by:  As directed      No wound care  Complete by:  As directed             Medication List    TAKE these medications        HYDROcodone-acetaminophen 5-325 MG per tablet  Commonly known as:  NORCO  Take 1 tablet by mouth every 4 (four) hours as needed for moderate pain.     levETIRAcetam 500 MG tablet  Commonly known as:  KEPPRA  Take 1 tablet (500 mg total) by mouth 2 (two) times daily.           Follow-up Information    [facility]     On 12/09/2014.   Why:  at 11:15 am; please try to keep your apt or call to reschedule   Contact information:   [address] [place] [phone]      Signed: Cristi LoronJENKINS,Tyrisha Benninger D 12/07/2014, 9:43 AM

## 2014-12-07 NOTE — Progress Notes (Signed)
Pt provided with taxi voucher for transportation home.
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
## 2014-12-11 ENCOUNTER — Ambulatory Visit: Payer: Self-pay | Attending: Family Medicine | Admitting: Family Medicine

## 2014-12-11 VITALS — BP 109/70 | HR 99 | Temp 97.4°F | Resp 16 | Ht 62.0 in | Wt 134.0 lb

## 2014-12-11 DIAGNOSIS — R1314 Dysphagia, pharyngoesophageal phase: Secondary | ICD-10-CM

## 2014-12-11 DIAGNOSIS — S065X0A Traumatic subdural hemorrhage without loss of consciousness, initial encounter: Secondary | ICD-10-CM

## 2014-12-11 NOTE — Patient Instructions (Signed)
Follow-instructions from hospital as to symptoms to return to hospital for. Take medications as prescribed. Talk to financial people. Make appointment with primary care doctor here for 2 weeks.
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
## 2014-12-19 ENCOUNTER — Ambulatory Visit: Payer: Self-pay | Admitting: Family Medicine

## 2014-12-27 ENCOUNTER — Ambulatory Visit: Payer: Self-pay | Attending: Family Medicine | Admitting: Internal Medicine

## 2014-12-27 ENCOUNTER — Encounter: Payer: Self-pay | Admitting: Internal Medicine

## 2014-12-27 VITALS — BP 116/78 | HR 72 | Temp 98.8°F | Resp 16 | Wt 132.0 lb

## 2014-12-27 DIAGNOSIS — F1010 Alcohol abuse, uncomplicated: Secondary | ICD-10-CM | POA: Insufficient documentation

## 2014-12-27 DIAGNOSIS — F1911 Other psychoactive substance abuse, in remission: Secondary | ICD-10-CM

## 2014-12-27 DIAGNOSIS — F1011 Alcohol abuse, in remission: Secondary | ICD-10-CM

## 2014-12-27 DIAGNOSIS — Z87898 Personal history of other specified conditions: Secondary | ICD-10-CM

## 2014-12-27 DIAGNOSIS — R51 Headache: Secondary | ICD-10-CM | POA: Insufficient documentation

## 2014-12-27 DIAGNOSIS — Z139 Encounter for screening, unspecified: Secondary | ICD-10-CM

## 2014-12-27 DIAGNOSIS — Z8679 Personal history of other diseases of the circulatory system: Secondary | ICD-10-CM

## 2014-12-27 DIAGNOSIS — R519 Headache, unspecified: Secondary | ICD-10-CM

## 2014-12-27 LAB — COMPLETE METABOLIC PANEL WITH GFR
ALT: 11 U/L (ref 0–53)
AST: 21 U/L (ref 0–37)
Albumin: 4.4 g/dL (ref 3.5–5.2)
Alkaline Phosphatase: 69 U/L (ref 39–117)
BUN: 11 mg/dL (ref 6–23)
CO2: 27 meq/L (ref 19–32)
Calcium: 9.3 mg/dL (ref 8.4–10.5)
Chloride: 101 mEq/L (ref 96–112)
Creat: 0.65 mg/dL (ref 0.50–1.35)
GFR, Est African American: >89 mL/min
GFR, Est Non African American: >89 mL/min
Glucose, Bld: 87 mg/dL (ref 70–99)
POTASSIUM: 4.1 meq/L (ref 3.5–5.3)
SODIUM: 140 meq/L (ref 135–145)
Total Bilirubin: 0.9 mg/dL (ref 0.2–1.2)
Total Protein: 7.1 g/dL (ref 6.0–8.3)

## 2014-12-27 MED ORDER — NAPROXEN 500 MG PO TABS: 500.0000 mg | ORAL_TABLET | Freq: Two times a day (BID) | ORAL | Status: AC
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
## 2014-12-27 NOTE — Progress Notes (Signed)
MRN: [id number] Name: Brett Cole  Sex: male Age: 34 y.o. DOB: [birth year]  Allergies: Review of patient's allergies indicates no known allergies.  Chief Complaint  Patient presents with  . Establish Care    HPI: Patient is 34 y.o. male who has history of alcohol abuse, polysubstance abuse, last month he was hospitalized with symptoms of headache, EMR reviewed patient had a fall prior to going to the hospital he had a subdural hematoma which was evacuated neurosurgery was on board subsequently he was discharged to continue with Keppra, currently patient is alert oriented x3 has occasional headache denies any numbness weakness,vision is still drink alcohol, I have advised patient to cut down and quit. Patient denies smoking cigarettes.   History reviewed. No pertinent past medical history.  Past Surgical History  Procedure Laterality Date  . Craniotomy Left 12/02/2014    Procedure: Craniotomy for Evacuation of Subdural Hematoma;  Surgeon: Tressie Stalker, MD;  Location: [facility] NEURO ORS;  Service: Neurosurgery;  Laterality: Left;      Medication List       This list is accurate as of: 12/27/14 12:40 PM.  Always use your most recent med list.               HYDROcodone-acetaminophen 5-325 MG per tablet  Commonly known as:  NORCO  Take 1 tablet by mouth every 4 (four) hours as needed for moderate pain.     levETIRAcetam 500 MG tablet  Commonly known as:  KEPPRA  Take 1 tablet (500 mg total) by mouth 2 (two) times daily.     naproxen 500 MG tablet  Commonly known as:  NAPROSYN  Take 1 tablet (500 mg total) by mouth 2 (two) times daily with a meal.        Meds ordered this encounter  Medications  . naproxen (NAPROSYN) 500 MG tablet    Sig: Take 1 tablet (500 mg total) by mouth 2 (two) times daily with a meal.    Dispense:  30 tablet    Refill:  2     There is no immunization history on file for this patient.  Family History  Problem Relation Age of Onset  .  Hypertension Mother   . Stroke Father     History  Substance Use Topics  . Smoking status: Never Smoker   . Smokeless tobacco: Not on file  . Alcohol Use: 12.6 oz/week    21 Cans of beer per week    Review of Systems   As noted in HPI  Filed Vitals:   12/27/14 1236  BP: 116/78  Pulse: 72  Temp:   Resp:     Physical Exam  Physical Exam  Constitutional: He is oriented to person, place, and time.  HENT:  Left-side on scalp healing scar no apparent discharge   Eyes: EOM are normal. Pupils are equal, round, and reactive to light.  Neck: Neck supple.  Cardiovascular: Normal rate and regular rhythm.   Pulmonary/Chest: Breath sounds normal. No respiratory distress. He has no wheezes. He has no rales.  Abdominal: Bowel sounds are normal. He exhibits no distension. There is no tenderness. There is no rebound.  Musculoskeletal: He exhibits no edema.  Neurological: He is alert and oriented to person, place, and time. He has normal reflexes. No cranial nerve deficit. He exhibits normal muscle tone. Coordination normal.  Psychiatric: Affect normal.    CBC    Component Value Date/Time   WBC 8.2 12/03/2014 0915   RBC 3.85* 12/03/2014  0915   HGB 11.8* 12/03/2014 0915   HCT 34.9* 12/03/2014 0915   PLT 246 12/03/2014 0915   MCV 90.6 12/03/2014 0915   LYMPHSABS 1.1 12/03/2014 0915   MONOABS 0.8 12/03/2014 0915   EOSABS 0.1 12/03/2014 0915   BASOSABS 0.0 12/03/2014 0915    CMP     Component Value Date/Time   NA 136 12/04/2014 1055   K 3.3* 12/04/2014 1055   CL 101 12/04/2014 1055   CO2 28 12/04/2014 1055   GLUCOSE 126* 12/04/2014 1055   BUN <5* 12/04/2014 1055   CREATININE 0.73 12/04/2014 1055   CALCIUM 8.6 12/04/2014 1055   GFRNONAA >90 12/04/2014 1055   GFRAA >90 12/04/2014 1055    No results found for: CHOL  No results found for: HGBA1C  No results found for: AST  Assessment and Plan  History of subdural hemorrhage - Plan: status post Craniotomy and   Evacuation  done last month patient is on Keppra Ambulatory referral to Neurology  Headache, unspecified headache type - Plan: naproxen (NAPROSYN) 500 MG tablet, Ambulatory referral to Neurology  History of alcohol abuse - Plan: COMPLETE METABOLIC PANEL WITH GFR  History of substance abuse - Plan: HIV antibody (with reflex), Hepatitis B surface antibody, Hepatitis B surface antigen, Hepatitis C RNA quantitative  Screening - Plan: Vit D  25 hydroxy (rtn osteoporosis monitoring), Hemoglobin A1c    Return in about 3 months (around 03/28/2015), or if symptoms worsen or fail to improve.   This note has been created with Education officer, environmentalDragon speech recognition software and smart phrase technology. Any transcriptional errors are unintentional.    Doris CheadleADVANI, Sarah-Jane Nazario, MD

## 2014-12-28 LAB — HEPATITIS B SURFACE ANTIBODY,QUALITATIVE: HEP B S AB: NEGATIVE

## 2014-12-28 LAB — HEMOGLOBIN A1C
Hgb A1c MFr Bld: 5.4 % (ref <5.7)
MEAN PLASMA GLUCOSE: 108 mg/dL (ref <117)

## 2014-12-28 LAB — HEPATITIS B SURFACE ANTIGEN: Hepatitis B Surface Ag: NEGATIVE

## 2014-12-28 LAB — HIV ANTIBODY (ROUTINE TESTING W REFLEX): HIV 1&2 Ab, 4th Generation: NONREACTIVE

## 2014-12-28 LAB — VITAMIN D 25 HYDROXY (VIT D DEFICIENCY, FRACTURES): VIT D 25 HYDROXY: 14 ng/mL — AB (ref 30–100)

## 2014-12-30 ENCOUNTER — Telehealth: Payer: Self-pay

## 2014-12-30 LAB — HEPATITIS C RNA QUANTITATIVE: HCV QUANT: NOT DETECTED [IU]/mL (ref <15)

## 2014-12-30 MED ORDER — VITAMIN D (ERGOCALCIFEROL) 1.25 MG (50000 UNIT) PO CAPS: 50000.0000 [IU] | ORAL_CAPSULE | ORAL | Status: AC

## 2014-12-30 NOTE — Telephone Encounter (Signed)
Interpreter line used Patient not available Left message to return our call 

## 2014-12-30 NOTE — Telephone Encounter (Signed)
-----   Message from Doris Cheadleeepak Advani, MD sent at 12/30/2014  9:29 AM EDT ----- Blood work reviewed, noticed low vitamin D, call patient advise to start ergocalciferol 50,000 units once a week for the duration of  12 weeks, then take OTC vitamin d 2000 units daily.

## 2014-12-31 ENCOUNTER — Telehealth: Payer: Self-pay

## 2014-12-31 NOTE — Telephone Encounter (Signed)
-----   Message from Doris Cheadleeepak Advani, MD sent at 12/31/2014  9:41 AM EDT ----- Call and let the patient know that his test for hepatitis C is negative

## 2014-12-31 NOTE — Telephone Encounter (Signed)
Interpreter line used carlos [phone] Patient not available

## 2015-01-08 ENCOUNTER — Ambulatory Visit: Payer: Self-pay | Attending: Internal Medicine

## 2015-12-21 ENCOUNTER — Emergency Department (HOSPITAL_COMMUNITY): Payer: Self-pay

## 2015-12-21 ENCOUNTER — Emergency Department (HOSPITAL_COMMUNITY)
Admission: EM | Admit: 2015-12-21 | Discharge: 2015-12-22 | Disposition: A | Discharge status: Home / Self Care | Payer: Self-pay | Attending: Emergency Medicine | Admitting: Emergency Medicine

## 2015-12-21 DIAGNOSIS — S022XXA Fracture of nasal bones, initial encounter for closed fracture: Secondary | ICD-10-CM

## 2015-12-21 DIAGNOSIS — S0083XA Contusion of other part of head, initial encounter: Secondary | ICD-10-CM | POA: Insufficient documentation

## 2015-12-21 DIAGNOSIS — F10129 Alcohol abuse with intoxication, unspecified: Secondary | ICD-10-CM | POA: Insufficient documentation

## 2015-12-21 DIAGNOSIS — Y9289 Other specified places as the place of occurrence of the external cause: Secondary | ICD-10-CM | POA: Insufficient documentation

## 2015-12-21 DIAGNOSIS — X58XXXA Exposure to other specified factors, initial encounter: Secondary | ICD-10-CM | POA: Insufficient documentation

## 2015-12-21 DIAGNOSIS — Y998 Other external cause status: Secondary | ICD-10-CM | POA: Insufficient documentation

## 2015-12-21 DIAGNOSIS — Y9389 Activity, other specified: Secondary | ICD-10-CM | POA: Insufficient documentation

## 2015-12-21 DIAGNOSIS — F10929 Alcohol use, unspecified with intoxication, unspecified: Secondary | ICD-10-CM

## 2015-12-21 DIAGNOSIS — R040 Epistaxis: Secondary | ICD-10-CM

## 2015-12-21 LAB — CBC
HCT: 42.1 % (ref 39.0–52.0)
Hemoglobin: 14.7 g/dL (ref 13.0–17.0)
MCH: 30 pg (ref 26.0–34.0)
MCHC: 34.9 g/dL (ref 30.0–36.0)
MCV: 85.9 fL (ref 78.0–100.0)
PLATELETS: 262 10*3/uL (ref 150–400)
RBC: 4.9 MIL/uL (ref 4.22–5.81)
RDW: 12.5 % (ref 11.5–15.5)
WBC: 5 10*3/uL (ref 4.0–10.5)

## 2015-12-21 LAB — COMPREHENSIVE METABOLIC PANEL
ALBUMIN: 4.1 g/dL (ref 3.5–5.0)
ALT: 28 U/L (ref 17–63)
AST: 44 U/L — AB (ref 15–41)
Alkaline Phosphatase: 84 U/L (ref 38–126)
Anion gap: 13 (ref 5–15)
CHLORIDE: 110 mmol/L (ref 101–111)
CO2: 23 mmol/L (ref 22–32)
Calcium: 8.7 mg/dL — ABNORMAL LOW (ref 8.9–10.3)
Creatinine, Ser: 0.64 mg/dL (ref 0.61–1.24)
GFR calc Af Amer: >60 mL/min (ref >60)
GFR calc non Af Amer: >60 mL/min (ref >60)
GLUCOSE: 99 mg/dL (ref 65–99)
POTASSIUM: 3.5 mmol/L (ref 3.5–5.1)
Sodium: 146 mmol/L — ABNORMAL HIGH (ref 135–145)
Total Bilirubin: 0.6 mg/dL (ref 0.3–1.2)
Total Protein: 7.4 g/dL (ref 6.5–8.1)

## 2015-12-21 LAB — ETHANOL: ALCOHOL ETHYL (B): 396 mg/dL — AB (ref <5)
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
## 2015-12-21 NOTE — Discharge Instructions (Signed)
It was our pleasure to provide your ER care today - we hope that you feel better.  Your xrays show a nasal bone fracture.  Keep your head elevated as much as possible to help with swelling and pain.   Ice/coldpack to sore area.  Take tylenol/motrin as need.  Follow up with ENT doctor in the next 1-2 weeks - see referral - call office for appointment.  Return to ER if worse, new symptoms, new or severe pain, persistent nose bleed, persistent vomiting, other concern.  For alcohol abuse, use resource guide provided for community resources/help with drinking alcohol.      Hemorragia nasal (Nosebleed) Las hemorragias nasales son frecuentes Se deben a una grieta en la membrana interna de la nariz (membrana mucosa) o al sangrado de un pequeo vaso sanguneo. Las hemorragias nasales pueden tener su origen en numerosos trastornos, por ejemplo, lesiones, infecciones, sequedad de las [address] mucosas o clima seco, medicamentos, hurgarse la nariz y los sistemas hogareos de calefaccin y Barista. La Brett Cole de las hemorragias nasales provienen de los vasos sanguneos de la parte anterior de la Brett Cole. INSTRUCCIONES PARA EL CUIDADO EN EL HOGAR   Para tratar de controlar la hemorragia nasal, oprmase las fosas nasales de manera suave y continua durante por lo menos .  No se suene ni resople por la nariz durante varias horas despus de haber tenido una hemorragia nasal.  No se coloque usted mismo gasa dentro de la nariz. Si el mdico le tapon la nariz con una compresa, trate de dejrsela puesta hasta que el profesional se la retire.  Si le colocaron un tapn de gasa y este comienza a salirse, reemplcelo con cuidado por otro o crtele el extremo.  Si se [place] un catter con baln para taponarle la nariz, no lo corte ni lo retire a Scientist, water quality haya indicado Brett Cole.  No se acueste mientras tiene una hemorragia nasal. Sintese e inclnese hacia delante.  Use un aerosol  nasal descongestivo para aliviar una hemorragia nasal como se lo haya indicado el mdico.  No se ponga vaselina ni aceite de vaselina en la nariz. Estos productos pueden gotear dentro de los pulmones.  Mantenga la humedad en su casa; para ello, use menos el aire acondicionado o utilice un humidificador.  La aspirina y los anticoagulantes aumentan la probabilidad de hemorragias. Si le recetan estos medicamentos y tiene hemorragias nasales, consltele al mdico si debe dejar de tomarlos o ajustar la dosis. No deje de tomar los medicamentos, a menos que se lo haya indicado el mdico.  Retome sus actividades normales cuando pueda, pero intente no hacer esfuerzos, no Lexicographer pesos y no Actor cintura para Physicist, medical.  Si la hemorragia nasal se debe a la sequedad de [employer], use un gel o aerosol nasal de solucin salina de [employer]. Esto mantendr las mucosas hmedas y permitir que se Midwife. Si debe usar un lubricante, elija los que sean solubles en agua. selos de forma ocasional y no los use despus de varias horas de estar acostado.  Concurra a todas las visitas de control como se lo haya indicado el mdico. Esto es importante. SOLICITE ATENCIN MDICA SI:  Lance Muss.  Tiene hemorragias nasales con frecuencia.  Tiene hemorragias nasales cada vez ms seguido. SOLICITE ATENCIN MDICA DE INMEDIATO SI:  La hemorragia nasal dura ms de .  La hemorragia nasal ocurre despus de una lesin en la cara, y la nariz parece estar torcida o fracturada.  Tiene hemorragias fuera de lo comn en otras partes del cuerpo.  Tiene hematomas fuera de lo comn en otras partes del cuerpo.  Se siente mareado o se desmaya.  Tiene sudoracin.  Vomita sangre.  La hemorragia nasal ocurre despus de una lesin en la cabeza.   Esta informacin no tiene Theme park managercomo fin reemplazar el consejo del mdico. Asegrese de hacerle al mdico cualquier pregunta que  tenga.   Document Released: 06/16/2005 Document Revised: 09/27/2014 Elsevier Interactive Patient Education 2016 Brett Inc.     Fractura de la nariz (Nasal Fracture) Brett BelliniUna fractura de la Darene Lamernariz es una quebradura de los [name] o del cartlago de la nariz. Las fracturas menores no requieren tratamiento y suelen consolidarse solas despus de aproximadamente un mes. Las fracturas graves pueden necesitar Azerbaijanciruga. CAUSAS Generalmente, la causa de esta lesin es una contusin en la [name]. Este tipo de lesin suele producirse debido a lo siguiente:  Deportes de Pharmacologistcontacto.  Accidentes automovilsticos.  Cadas.  Puetazos. [name] Los sntomas de esta lesin incluyen lo siguiente:  Engineer, [name].  Inflamacin de la nariz.  Hemorragia nasal.  Hematoma alrededor de la nariz o de los ojos. Esto incluye tener los ojos morados.  Aspecto desviado de la nariz. DIAGNSTICO Esta lesin se puede diagnosticar mediante un examen fsico. El mdico tocar suavemente la nariz en busca de signos de huesos fracturados. Examinar el interior de las fosas nasales para asegurarse de que no haya una inflamacin llena de sangre en el tabique que las separa (hematoma septal). Es posible que las radiografas de la nariz no revelen la presencia de Brett Cole fractura, [place] Mountainaunque se haya producido [name] quebradura. En algunos casos se pueden hacer radiografas o una tomografa computarizada (TC) en el trmino de 1 a 5das despus de la lesin. A veces, el mdico querr esperar hasta que la inflamacin haya desaparecido. TRATAMIENTO Las fracturas menores que no causaron deformidades no suelen requerir TEFL teachertratamiento. Las fracturas ms graves con desplazamiento de los huesos pueden requerir Azerbaijanciruga, la cual se Education officer, environmentalrealizar una vez que la inflamacin haya desaparecido. La ciruga servir para estabilizar y Engineer, technical salesalinear la fractura. En algunos casos, es posible que un mdico logre reacomodar los huesos sin recurrir a Chief Technology Officeruna ciruga. Este procedimiento  se puede llevar a cabo en el consultorio del mdico despus de la administracin de un medicamento para adormecer la zona (anestesia local). INSTRUCCIONES PARA EL CUIDADO EN EL HOGAR  Si se lo indican, aplique hielo sobre la zona lesionada:  Ponga el hielo en una bolsa plstica.  Coloque una toalla entre la piel y la bolsa de hielo.  Coloque el hielo durante 20minutos, 2 a 3veces por Futures traderda.  Tome los medicamentos de venta libre y los recetados solamente como se lo haya indicado el mdico.  Si le sangra la nariz, sintese erguido y apritese las partes blandas de la nariz contra el tabique que separa las fosas nasales (tabique nasal) durante 10minutos.  Intente no sonarse la nariz.  Reanude sus actividades normales como se lo haya indicado el mdico. Pregntele al mdico qu actividades son seguras para usted.  Evite los deportes de contacto durante 3 o 4semanas, o como se lo haya indicado el mdico.  Concurra a todas las visitas de control como se lo haya indicado el mdico. Esto es importante. SOLICITE ATENCIN MDICA SI:  Aumenta el dolor o se vuelve intenso.  Sigue teniendo hemorragias nasales.  La forma de la nariz no vuelve a la normalidad en el trmino de 5das.  Le supura pus de la nariz. SOLICITE  ATENCIN MDICA DE INMEDIATO SI:  Tiene una hemorragia nasal que no se detiene despus de Personnel officer las fosas nasales durante y de aplicarse hielo en la nariz.  Le sale un lquido transparente de la Clinical cytogeneticist.  Advierte que tiene una inflamacin parecida a una uva en el tabique. Esta inflamacin es una acumulacin de sangre (hematoma) que se debe drenar para evitar las infecciones.  Tiene dificultad para mover los ojos.  Ha vomitado repetidas veces.   Esta informacin no tiene Theme park manager el consejo del mdico. Asegrese de hacerle al mdico cualquier pregunta que tenga.   Document Released: 06/16/2005 Document Revised: 05/28/2015 Elsevier  Interactive Patient Education 2016 Elsevier Inc.    Intoxicacin alcohlica (Alcohol Intoxication) La intoxicacin alcohlica se produce cuando la cantidad de alcohol que se ha consumido daa la capacidad de funcionamiento mental y fsico. El alcohol deteriora directamente la actividad qumica normal del cerebro. Beber grandes cantidades de alcohol puede conducir a Building control surveyor funcionamiento mental y en el comportamiento, y puede causar muchos efectos fsicos que pueden ser perjudiciales.  La intoxicacin alcohlica puede variar en gravedad desde leve hasta muy grave. Hay varios factores que pueden afectar el nivel de intoxicacin que se produce, como la edad de la persona, el sexo, el peso, la frecuencia de consumo de alcohol, y la presencia de otras enfermedades mdicas (como diabetes, convulsiones o enfermedades del corazn). Los niveles peligrosos de intoxicacin por alcohol pueden ocurrir [employer] personas beben grandes cantidades de alcohol en un corto periodo de tiempo ([place]). El alcohol tambin puede ser especialmente peligroso cuando se combina con ciertos medicamentos recetados o drogas "recreativas". SIGNOS Y [name] Algunos de los signos y sntomas comunes de intoxicacin leve por alcohol incluyen:  Prdida de la coordinacin.  Cambios en el estado de nimo y la conducta.  Incapacidad para razonar.  Hablar arrastrando las palabras. A medida que la intoxicacin por alcohol avanza a niveles ms graves, Zenaida Niece a Research officer, trade union otros signos y sntomas. Estos pueden ser:  Vmitos.  Confusin y alteracin de Scientist, clinical (histocompatibility and immunogenetics).  Disminucin de Engineer, manufacturing systems.  Convulsiones.  Prdida de la conciencia. DIAGNSTICO  El mdico le har una historia clnica y un examen fsico. Se le preguntar acerca de la cantidad y el tipo de alcohol que ha consumido. Se le realizarn anlisis de sangre para medir la concentracin de alcohol en sangre. En muchos lugares, el nivel de alcohol  en la sangre debe ser inferior a 80 mg / dL (1,61%) para poder conducir legalmente. Sin embargo, hay muchos efectos peligrosos del alcohol que pueden ocurrir con niveles mucho ms bajos.  TRATAMIENTO  Las personas con intoxicacin por alcohol a menudo no requieren TEFL teacher. La mayor parte de los efectos de la intoxicacin por alcohol son temporales, y desaparecen a medida que el alcohol abandona el cuerpo de forma natural. El profesional controlar su estado hasta que est lo suficientemente estable como para volver a casa. A veces se administran lquidos por va intravenosa para ayudar a evitar la deshidratacin.  INSTRUCCIONES PARA EL CUIDADO EN EL HOGAR  No conduzca vehculos despus de beber alcohol.  Mantngase hidratado. Beba gran cantidad de lquido para mantener la orina de tono claro o color amarillo plido. Evite la cafena.   Tome slo medicamentos de venta libre o recetados, segn las indicaciones del mdico.  SOLICITE ATENCIN MDICA SI:   Tiene vmitos persistentes.   No mejora luego de [employer].  Se intoxica con alcohol con frecuencia. El mdico podr ayudarlo a  decidir si debe consultar a un terapeuta especializado en el abuso de sustancias. SOLICITE ATENCIN MDICA DE INMEDIATO SI:   Se siente vacilante o tembloroso cuando trata de abandonar el hbito.   Comienza a temblar de manera incontrolable (convulsiones).   Vomita sangre. Puede ser sangre de color rojo brillante o similar al sedimento del caf negro.   Maxie Better en la materia fecal. Puede ser de color rojo brillante o de aspecto alquitranado, con olor ftido.   Se siente mareado o se desmaya.  ASEGRESE DE QUE:   Comprende estas instrucciones.  Controlar su afeccin.  Recibir ayuda de inmediato si no mejora o si empeora.   Esta informacin no tiene Theme park manager el consejo del mdico. Asegrese de hacerle al mdico cualquier pregunta que tenga.   Document Released: 09/06/2005  Document Revised: 05/09/2013 Elsevier Interactive Patient Education 2016 Ar[employer]itor.    Crioterapia  (Cryotherapy)  El trmino crioterapia significa tratamiento mediante el fro. Bolsas con hielo o gel se utilizan para reducir Chief Technology Officer y la inflamacin. El hielo es ms efectivo dentro de las primeras 24 a 48 horas despus de una lesin o trastornos por uso excesivo de un msculo o Risk analyst. El hielo puede calmar esguinces, distensiones, espasmos, ardor, dolor punzante y [employer] Energy. Tambin puede usarse para la recuperacin luego de [place]. El hielo es [place], tiene muy pocos efectos adversos y es seguro para que lo utilicen la mayora de [employer].  PRECAUCIONES  El hielo no es una opcin segura de tratamiento para las personas con:   Fenmeno de Raynaud. Este es un trastorno que afecta los vasos sanguneos pequeos en las extremidades. La exposicin al fro D[employer] Energy Company problemas vuelvan.  Hipersensibilidad al fro. Hay diferentes tipos de hipersensibilidad al fro, [employer] se incluyen:  Urticaria por el fro. Ronchas rojas y que pican que aparecen en la piel cuando los tejidos comienzan a calentarse despus de recibir el fro.  Eritema por fro. Se trata de una erupcin de color rojo y que pica, causada por la exposicin al fro.  Hemoglobinuria por fro. Los glbulos rojos se destruyen cuando los tejidos comienzan a calentarse despus de enfriarse. La hemoglobina que transporta oxgeno pasa a la orina debido a que no se puede combinar con protenas de la sangre lo suficientemente rpido.  Entumecimiento o alteracin de la sensibilidad en el rea que se enfra. Si usted tiene [employer], no utilice hielo hasta que haya hablado con su mdico:   Enfermedades cardacas, como arritmias, angina o enfermedad cardaca crnica.  Hipertensin arterial.  Heridas que se estn curando o abiertas en la zona en la que va a aplicar el  hielo.  Infecciones actuales.  Artritis reumatoidea.  Mala circulacin.  Diabetes. El hielo disminuye el flujo de sangre en la regin en la que se aplica. Esto es beneficioso cuando se trata de evitar que se propaguen ciertas sustancias qumicas irritantes desde los tejidos inflamados a los tejidos circundantes. Sin embargo, si se expone la piel a las temperaturas fras durante demasiado tiempo o sin la proteccin [place], puede daarse la piel o los nervios. Observe si hay seales de dao en la piel debido al fro.  INSTRUCCIONES PARA EL CUIDADO EN EL HOGAR  Siga estos consejos para usar hielo y compresas fras con seguridad.   Coloque una toalla seca o hmeda entre el hielo y la piel. Una toalla hmeda se enfriar ms rpidamente la piel, lo que puede hacer necesario  acortar el tiempo que se utiliza el hielo.  Para obtener una respuesta ms rpida, puede comprimir suavemente el hielo.  Aplique el hielo durante no ms de 10 a 20 minutos a la vez. Cuanto ms hueso haya en la zona en la que aplique el hielo, menos tiempo se necesitar para obtener los beneficios.  Revise su piel despus de 5 minutos para asegurarse de que no hay seales de [employer] al fro o un dao en la piel.  Descanse 20 minutos o ms Union [employer] Corporation.  Una vez que la piel est adormecida, puede finalizar el [place]. Puede probar si hay adormecimiento tocando ligeramente la piel. El toque debe ser tan ligero que no deje un hoyuelo en la piel por la presin hecha con la punta del dedo. Al aplicar hielo, la Brett Cole de las personas sentirn sensaciones normales en este orden: fro, ardor, dolor y entumecimiento.  No use hielo sobre alguien que no puede comunicar sus respuestas al dolor, como los nios pequeos o personas con demencia. CMO HACER UNA COMPRESA DE HIELO  Las compresas de hielo son el modo ms frecuente de Chemical engineer la terapia con hielo. Otros mtodos son los masajes con hielo, baos de  hielo, y aerosol fro. Las cremas musculares que producen fro, sensacin de hormigueo no ofrecen los mismos beneficios que ofrece el hielo y no debe ser utilizado como un sustituto excepto que lo recomiende su mdico.  Para hacer una compresa de hielo, haga lo siguiente:   Ponga hielo picado o una bolsa de verduras congeladas en una bolsa de plstico con cierre. Extraiga el exceso de Industry. Coloque esta bolsa dentro de [place] bolsa de plstico. Deslice la bolsa en una funda de almohada o coloque una toalla hmeda entre su piel y la [place].  Mezcle 3 partes de agua con 1 parte de alcohol fino. Congelar la mezcla en una bolsa plstica con cierre. Cuando se retira Set designer del Electrical engineer, tendr un aspecto fangoso. Extraiga el exceso de [place]. Coloque esta bolsa dentro de [place] bolsa de plstico. Deslice la bolsa en una funda de almohada o coloque una toalla hmeda entre su piel y la [place]. SOLICITE ATENCIN MDICA SI:   Tiene manchas blancas en la piel. Esto puede dar a la piel una apariencia (moteada).  Su piel se vuelve azul o plida.  Tiene un aspecto ceroso o est dura.  La hinchazn empeora. ASEGRESE DE QUE:   Comprende estas instrucciones.  Controlar su enfermedad.  Solicitar ayuda de inmediato si no mejora o si empeora.   Esta informacin no tiene Theme park manager el consejo del mdico. Asegrese de hacerle al mdico cualquier pregunta que tenga.   Document Released: 08/26/2011 Document Revised: 11/29/2011 Elsevier Interactive Patient Education 2016 [employer].    Trastorno por abuso de alcohol (Alcohol Use Disorder) El trastorno por abuso de alcohol es un trastorno mental. No se trata de un incidente nico en el que se bebe en abundancia. Este trastorno se debe al consumo incontrolable del alcohol a lo largo del [place], que causa dificultades en una o ms reas de la vida diaria. Al consumir en exceso, las personas que sufren este trastorno estn en riesgo de daarse a s  mismos y a Dentist. Tambin puede causar otros trastornos [place], como trastornos del [place] de nimo y trastornos de [place], y problemas fsicos graves. Las personas que sufren trastornos por consumo de alcohol, generalmente abusan de otras sustancias.  Los trastornos por consumo de alcohol son frecuentes y  se han generalizado. Algunas personas beben alcohol para poder enfrentarse o escapar a las situaciones negativas de la vida. Otros beben para Government social research officer crnico o los sntomas de una enfermedad mental. Las personas con historia familiar de trastornos por consumo de alcohol tienen ms riesgo de perder el control y de consumir alcohol en exceso.  Beber mucho alcohol puede ser causa de traumatismos, accidentes y [address] de [place]. Un trago puede ser demasiado si:  Problemas laborales.  Est embarazada o amamantando.  Est tomando medicamentos. Consulte a su mdico.  Est conduciendo o va a hacerlo. [name]  Los signos y sntomas pueden ser:   Consumo dealcohol engrandes cantidades o durante perodos ms prolongados que lo previsto.  Mltiples intentos fallidos de dejar de bebero controlar el consumo de alcohol.   Emplear gran cantidad de tiempo para obtener, consumir o recuperarse de los efectos del alcohol (resaca).  Un deseo intenso o necesidad de consumir alcohol (abstinencia).   Continuar consumiendo alcohol a pesar de los [employer], en la escuela o en el hogar debido al consumo.   Continuar consumiendo a Scientist, water quality de American Standard Companies de relacin debido al consumo.  Continuar consumiendo an en situaciones que impliquen un peligro fsico, como al conducir un vehculo.  Continuar consumiendo a pesar de saber que un problema fsico o psicolgico probablemente se relaciones con el consumo de alcohol. Los problemas fsicos relacionados con el consumo de alcohol pueden producirse en el cerebro, el corazn, el hgado, el estmago y los intestinos. Los problemas  psicolgicos relacionados con el consumo de alcohol incluyen intoxicacin, depresin, ansiedad, psicosis, delirio y Photographer.   La necesidad de aumentar la cantidad de alcohol para Wellsite geologist deseado, o una disminucin del efecto al consumir la misma cantidad de alcohol (tolerancia).  Sndrome de abstinencia al reducir o Photographer consumo, o tener que consumir alcohol para reducir o Multimedia programmer sndrome de abstinencia. Los sntomas de la abstinencia incluyen:  Frecuencia cardaca acelerada.  Temblores en las manos.  Dificultad para dormir.  Nuseas.  Vmitos.  Alucinaciones.  Agitacin.  Convulsiones. DIAGNSTICO  El mdico es el encargado de diagnosticar el trastorno por consumo de alcohol. Comenzar hacindole tres o cuatro preguntas para evaluar si consume alcohol en forma excesiva o si representa un problema. Para confirmar el diagnstico de trastorno por consumo de alcohol, deben estar presentes al [employer] un perodo de 12 meses. La gravedad del trastorno depende del nmero de sntomas:   Leve--dos o tres.  Moderado--cuatro o cinco.  Grave--seis o ms. El mdico podr Sports coach un examen fsico o [employer] los [place] de las pruebas de laboratorio para ver si usted tiene problemas fsicos como resultado del consumo de alcohol. Podr derivarlo a Administrator, sports de la salud mental para que realice una evaluacin.  TRATAMIENTO  Algunas personas podrn reducir el consumo a niveles en los que haya un bajo riesgo. Otras personas deben dejar de beber. Cuando sea necesario, podr recibir ayuda de los profesionales de la salud mental, capacitados en el tratamiento del abuso de sustancias. El mdico podr informarlo sobre la gravedad de su trastorno por consumo de alcohol y decidir qu tipo de tratamiento necesita. Las formas de tratamiento disponibles son:   Desintoxicacin. La desintoxicacin consiste en el uso de medicamentos recetados para evitar el sndrome  de abstinencia durante la primera semana en la que se deja de consumir. Esto es importante para las personas con historia de sndrome de abstinencia y para los que consumen en  exceso, quienes probablemente sufran el sndrome de abstinencia. El sndrome de abstinencia puede ser peligroso y en algunos casos puede causar la [place]. La desintoxicacin generalmente se realiza internado en un hospital o en una institucin para pacientes que abusan de sustancias.  Asesoramiento psicolgico o psicoterapia. La psicoterapia la realizan terapeutas especializados en el tratamiento de pacientes que abusan de sustancias. Se evalan los motivos que llevan a consumir alcohol, y los modos para Furniture conservator/restorer a consumir. Los PepsiCo de la psicoterapia son ayudar a que las personas con trastornos por consumo de alcohol encuentren actividades saludables y Standard Pacific de Radio broadcast assistant estrs de la vida, a identificar y Programmer, applications disparadores que originan el consumo de alcohol y a Scientist, physiological abstinencia, que puede originar una recada.  Medicamentos.Hay diferentes medicamentos que pueden ayudar a tratar el trastorno por consumo de alcohol a travs de los siguientes efectos:  Controlan la ansiedad por consumir.  Disminuyen la respuesta de recompensa positiva que se siente al consumir.  Causan una reaccin fsica desagradable cuando se consume alcohol (terapia por aversin).  Grupos de apoyo. Los grupos de apoyo son dirigidos por personas que han dejado de consumir. Ellos brindan apoyo emocional, consejo y [place]. Estas formas de tratamiento generalmente se combinan. Algunas personas se benefician con el tratamiento combinado intensivo que se ofrece en algunos centros de tratamiento especializados en el abuso de sustancias. Existen programas para pacientes hospitalizados o ambulatorios.    Esta informacin no tiene Theme park manager el consejo del mdico. Asegrese de hacerle al mdico cualquier pregunta que tenga.     Document Released: 09/06/2005 Document Revised: 09/27/2014 Elsevier Interactive Patient Education 2016 [employer].    [employer] Guide Outpatient Counseling/Substance Abuse Adult The [employer] 211 is a great source of information about community services available.  Access by dialing 2-1-1 from anywhere in [place], or by website -  PooledIncome.pl.   Other Local Resources (Updated 09/2015)  Crisis Hotlines   Services     Area Served  [employer]  Crisis Hotline, available 24 hours a day, 7 days a week: [phone] [facility], [place]   [facility]  Crisis Hotline, available 24 hours a day, 7 days a week: [phone] [facility], [place]  [facility]  Suicide Prevention Hotline, available 24 hours a day, 7 days a week: [phone] [facility], [place]  [employer], available 24 hours a day, 7 days a week: [phone] [facility], [place]   [facility] Access to [employer], available 24 hours a day, 7 days a week: [phone] All   [facility]  Crisis Hotline, available 24 hours a day, 7 days a week: [phone] All   Other Local Resources (Updated 09/2015)  Outpatient Counseling/ Substance Abuse Programs  Services     Address and Phone Number  [facility] ([facility])   Options include Individual counseling, group counseling, intensive outpatient program (several hours a day, several days a week)  Offers depression assessments  Provides methadone maintenance program [phone] [address], [address] [place], [place] [address]   [facility]   Offers partial hospitalization/day treatment and DUI/DWI programs  [employer], private insurance [phone] [address], [address] [place], [place] [address]  [facility]    Services include intensive outpatient program (several hours a day, several days a week), outpatient  treatment, DUI/DWI services, family education  Also has some services specifically for [employer] transitional housing  [phone] [address] [employer]  [place], [place] [address]     [facility]  Accepts Medicare, private pay, and private insurance [phone] [address] [place], [address] [place], [place] [address]  [employer] [facility]  Services include individual counseling, substance abuse intensive outpatient program (several hours a day, several days a week), day treatment  Delene Loll, Medicaid, private insurance [phone] [address], [address] [place], [place] [address]  [facility]   Offers substance abuse intensive outpatient program (several hours a day, several days a week), partial hospitalization program [phone] [address] [place], [place] [address]  [phone] [address] [place], [place] [address]  [phone] [address] [place], [place] [address]  [phone], [address] [place], [place] [address]  [facility]  Individual counseling only  Accepts private insurance only [phone] [address] [place], [place] [address]  [facility]  Methadone maintenance program [phone] [address] [place], [place] [address]  [facility] providing substance abuse and mental health counseling  Accepts Medicaid, Medicare, private insurance  Offers sliding scale for uninsured [phone] [address] [place], [place]   [facility] [place], [employer]  Offers individual counseling, and intensive in-home services [phone] [address], [address] [place], [place] [address]  [facility] individual counseling, family counseling, group therapy, domestic violence counseling, consumer credit counseling  Accepts Medicare, Medicaid, private  insurance  Offers sliding scale for uninsured [phone] [address] [place], [place] [address]  [phone] [facility], [address] [place], [place] [address]  [facility]  Offers individual, family and group counseling  3 locations - [place], [place], and [place]  [phone]  [address] [place], [place] [address]  [address] [place], [place] [address]  [address] [place], [place] [address]  [facility]    Offers psychiatric assessment, 8-week Intensive Outpatient Program (several hours a day, several times a week, daytime or evenings), early recovery group, family Program, medication management  Private pay or private insurance only [phone], or  [phone] [address] [place], [place] [address]  [facility] [place] [employer] individual, couples and family counseling  Accepts Medicaid, private insurance, and sliding scale for uninsured [phone] [address] [place], [place] [address]  Len Blalock, MD  Individual counseling  Private insurance [phone] [address] [place], [place] [address]  [facility]   Offers assessment, substance abuse treatment, and behavioral health treatment [phone] [address] [place], [place] [address]  [facility]  Individual counseling  Accepts private insurance [phone] [address] [place], [place] [address]  [facility]  Individual counseling  Accepts Medicare, private insurance [phone] [address] [place], [place] [address]  [facility]    Offers intensive outpatient program (several hours a day, several times a week)  Private pay, private insurance [phone] [facility] [place], [place]  [facility]  Individual counseling  Medicare, private insurance [phone] [address] [place], [place] [address]  [facility]    Offers intensive outpatient program (several hours a day, several times a week) and partial hospitalization program [phone] [address] [place], [place] [address]  Emerson Monte, MD  Individual counseling [phone] [address] [place], [place] [address]  [facility] counseling to individuals, couples, and families  Accepts  Medicare and private insurance; offers sliding scale for uninsured [phone] [address] [place], [place] [address]  [facility]  [place] [facility] [phone] [address] [place], [place] [address]  [facility] crisis counseling, individual counseling, group therapy, in-home therapy, domestic violence services, day treatment, DWI services, Administrator, arts (CST), Doctor, hospital (ACTT), substance abuse Intensive Outpatient Program (several hours a day, several times a week)  2 locations - [place] and [place] [phone] [address] [place], [place] [address]  [phone] [address] [place], [place] [address]  [facility]     Individual counseling and group therapy  [employer], [place], [place] [phone] [address] [place], [place]  [facility]  Offers individual and family counseling  Offers LGBTQ services  Accepts private insurance and private pay [phone] [address] [place], [place] [address]  [facility]    Offers individual counseling, group therapy, and outpatient detox  Accepts private insurance [phone] [address] [place], [place]  [facility]  Individual counseling  Accepts Medicare, private insurance [phone] [address] [place], [place] [address]  [employer]  Individual  counseling  Accepts Medicare, private insurance [phone] [address] [place], [place] [address]  [facility]   Offers substance abuse Intensive Outpatient Program (several hours a day, several times a week) [phone], or [phone] [place], [place]
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
## 2015-12-21 NOTE — ED Notes (Signed)
CRITICAL VALUE ALERT  Critical value received: ETOH 396
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
## 2016-03-19 IMAGING — CT CT HEAD W/O CM
2 series · 15 of 30 positions shown, 19 images · non-contrast
Comparison: 12/02/2014

CLINICAL DATA: Follow-up subdural hematoma

EXAM:
CT HEAD WITHOUT CONTRAST
TECHNIQUE: Contiguous axial images were obtained from the base of the skull
through the vertex without intravenous contrast.

[Series 201: head w/o, idose (1) · axial · non-contrast · 0.49mm/px · z∈[+369,+499]mm · 13 of 32 slices shown, 17 images]
[im 3/32  brain]
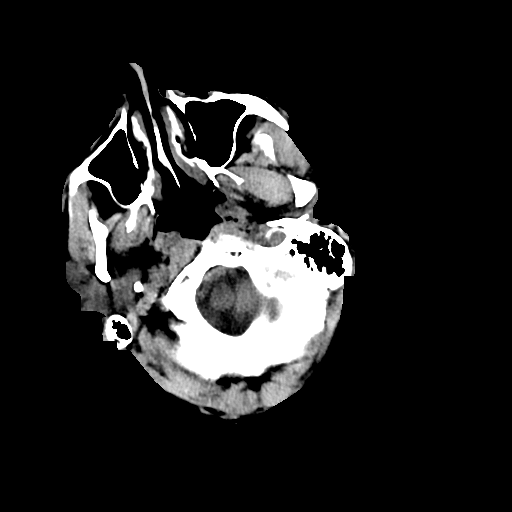
[im 3/32  bone]
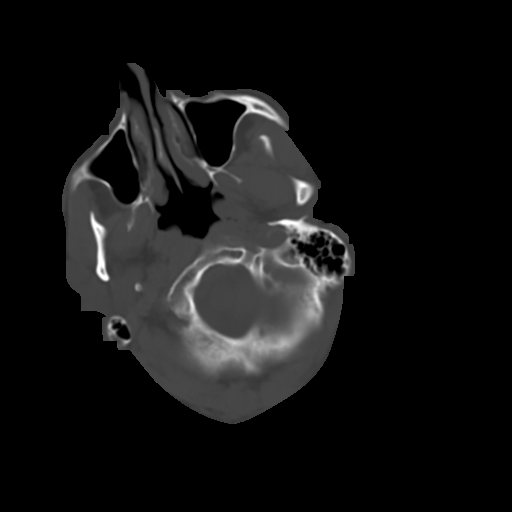
[im 5/32  brain]
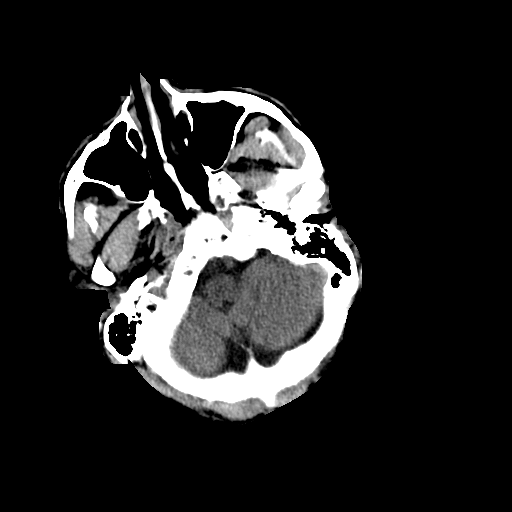
[im 7/32  brain]
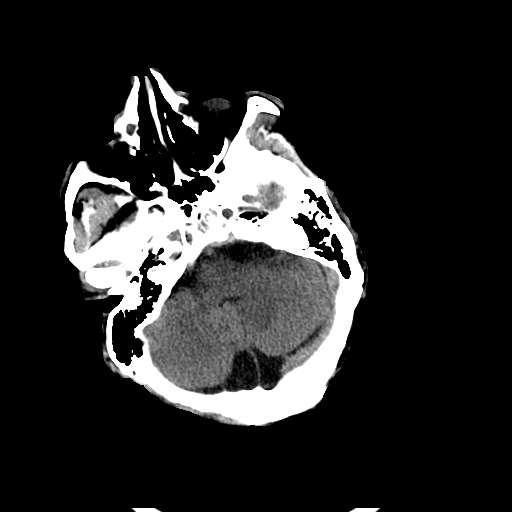
[im 9/32  brain]
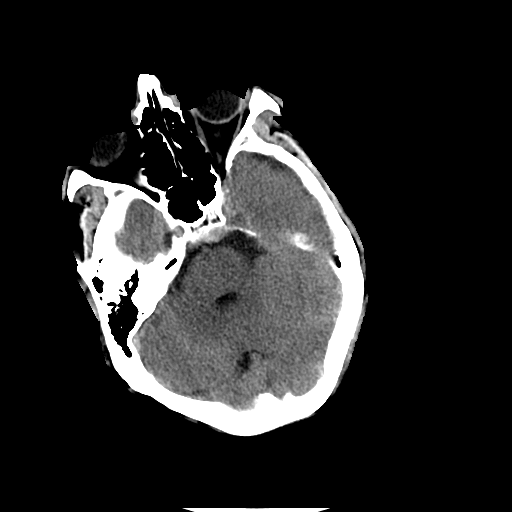
[im 12/32  brain]
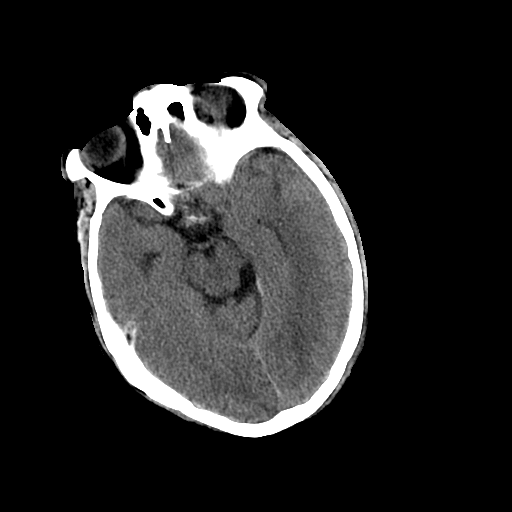
[im 12/32  bone]
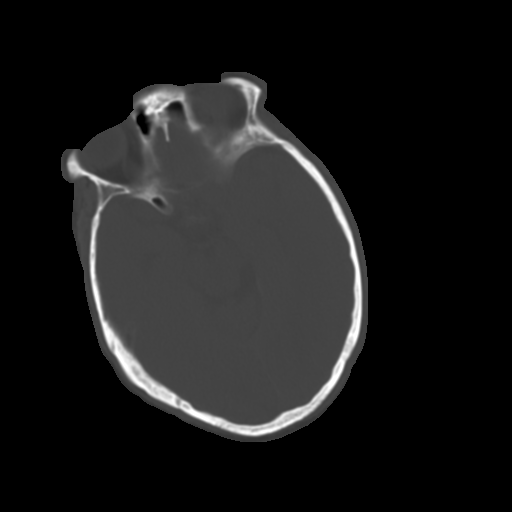
[im 14/32  brain]
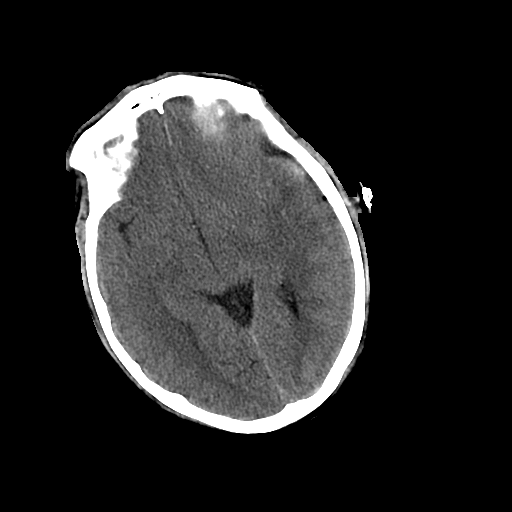
[im 16/32  brain]
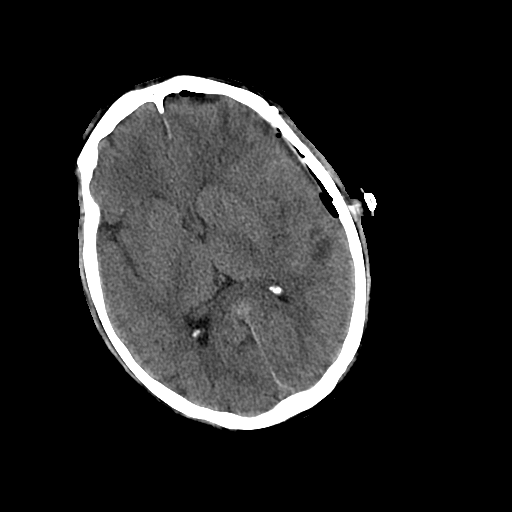
[im 18/32  brain]
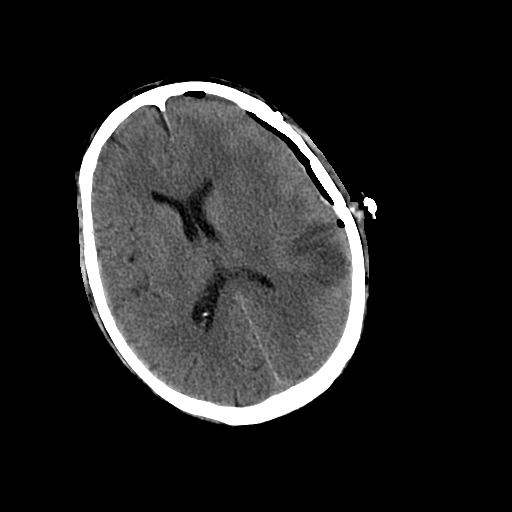
[im 20/32  brain]
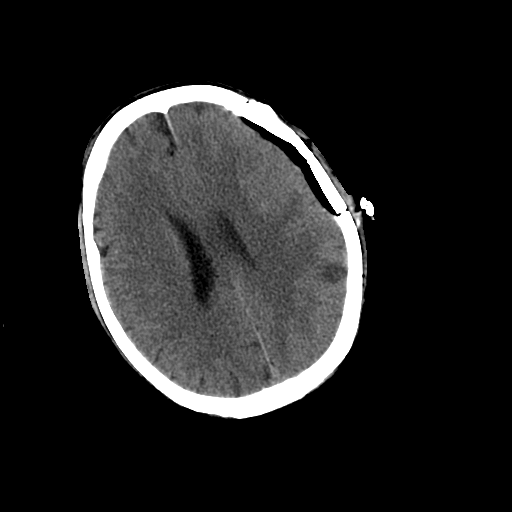
[im 20/32  bone]
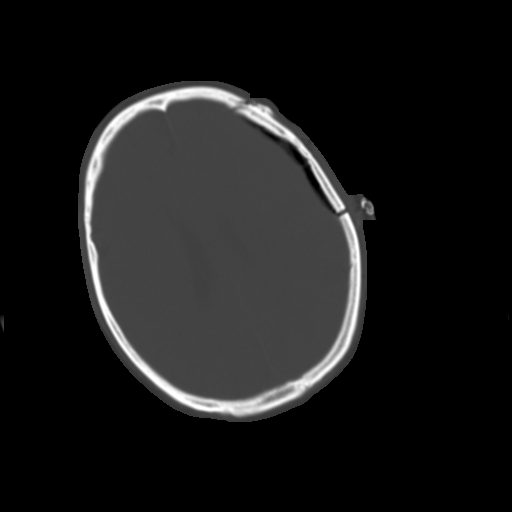
[im 23/32  brain]
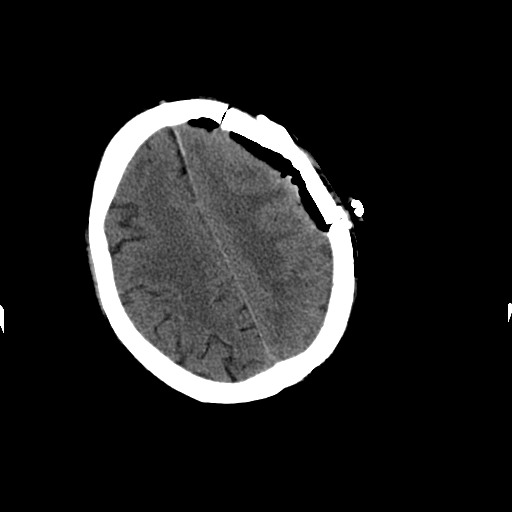
[im 25/32  brain]
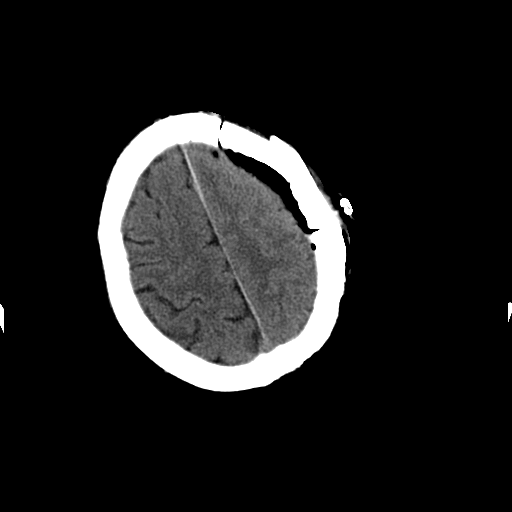
[im 27/32  brain]
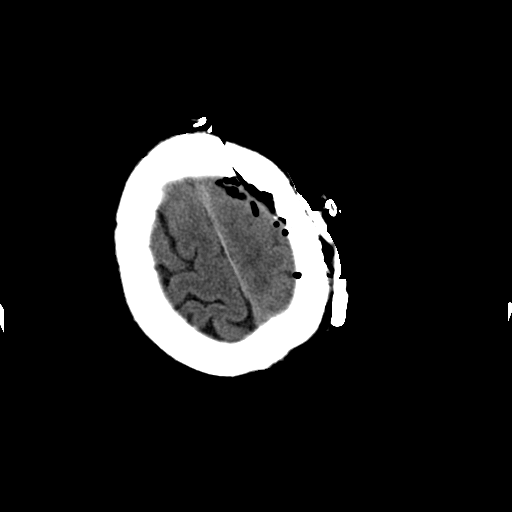
[im 29/32  brain]
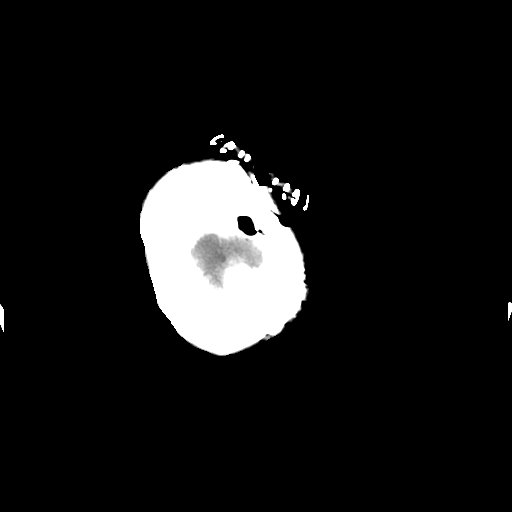
[im 29/32  bone]
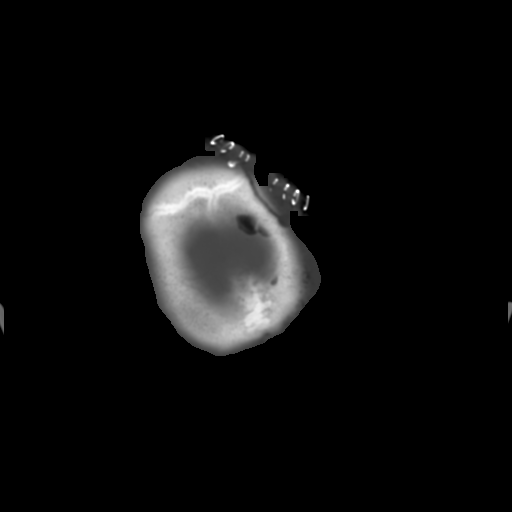

[Series 202: head w/o bone, idose (1) · axial · non-contrast · 0.49mm/px · z∈[+369,+389]mm · 2 of 32 slices shown]
[im 3/32  bone]
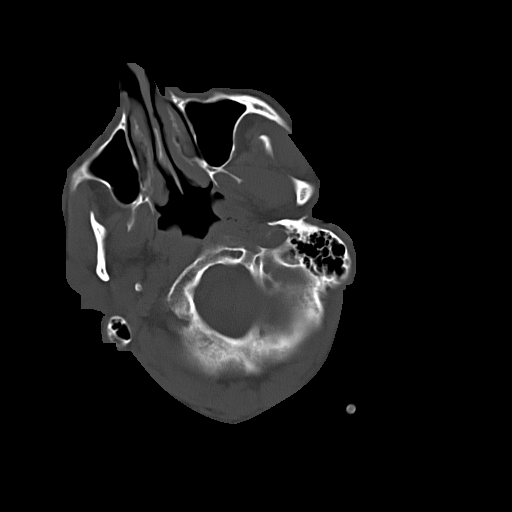
[im 7/32  bone]
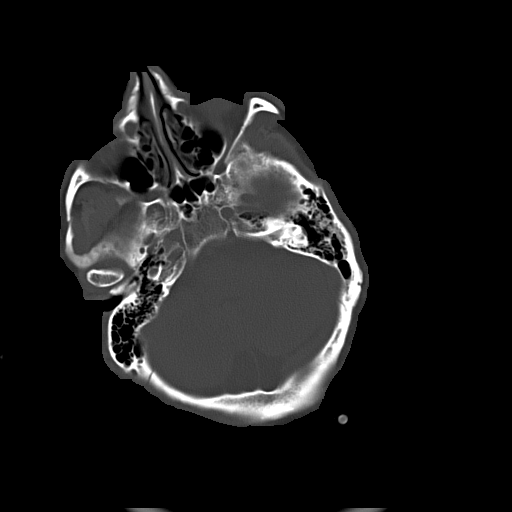

[15 of 30 positions shown; findings below may reference images not displayed]

FINDINGS: There is a left frontal craniotomy. There has been evacuation of the
subdural hematoma. There is a small volume of intracranial air.
Retro clival and ventral foramen magnum subdural hematomas persist
without significant interval change. Hemorrhagic contusions are
again evident in the left frontal lobe and temporal lobe. There is
nonhemorrhagic contusion or infarction in the posterior left
frontoparietal region. There continues to be left-to-right midline
shift, approximately 7.5 mm. There continues to be moderate mass
effect on the left lateral ventricular system although the degree of
ventricular attenuation is reduced. Basal cisterns remain patent.
IMPRESSION: Evacuation of the left subdural hematoma. Persistent subdural blood
in the retro clival and ventral foramen magnum regions. Persistent
left-to-right midline shift. Reduced degree of mass effect on the
left lateral ventricular system. Multifocal hemorrhagic contusions.
